# Patient Record
Sex: Female | Born: 1985 | Hispanic: Yes | Marital: Single | Smoking: Never smoker
Health system: Southern US, Community
[De-identification: ages and names within clinical notes are randomized; demographics above are authoritative.]

## PROBLEM LIST (undated history)

## (undated) ENCOUNTER — Inpatient Hospital Stay: Payer: Self-pay

## (undated) DIAGNOSIS — Z789 Other specified health status: Secondary | ICD-10-CM

## (undated) HISTORY — PX: NO PAST SURGERIES: SHX2092

---

## 2014-05-19 NOTE — L&D Delivery Note (Signed)
Delivery Note At 1:46 PM a viable female was delivered via Vaginal, Spontaneous Delivery Occiput Anterior presentation. Vtx, Ant and post shoulder and body del completely and to mom's abd. APGAR: 9, 9; weight 6 lb 14.8 oz (3140 g).   Placenta status: Intact, Spontaneous.  Cord:  3 VC. Cord blood collected. Calcifications noted on placenta.   Anesthesia: Epidural  Episiotomy:  none Lacerations:  2nd degree perineal/vaginal lac Suture Repair: 3.0 vicryl rapide Est. Blood Loss (mL):    Mom to postpartum.  Baby to Couplet care / Skin to Skin.  Sharee Pimplearon W Catharina Pica 05/08/2015, 2:12 PM

## 2014-11-30 ENCOUNTER — Other Ambulatory Visit: Payer: Self-pay | Admitting: Family Medicine

## 2014-11-30 DIAGNOSIS — Z3490 Encounter for supervision of normal pregnancy, unspecified, unspecified trimester: Secondary | ICD-10-CM

## 2014-12-22 ENCOUNTER — Ambulatory Visit
Admission: RE | Admit: 2014-12-22 | Discharge: 2014-12-22 | Disposition: A | Discharge status: Home / Self Care | Payer: Self-pay | Source: Ambulatory Visit | Attending: Family Medicine | Admitting: Family Medicine

## 2014-12-22 DIAGNOSIS — Z36 Encounter for antenatal screening of mother: Secondary | ICD-10-CM | POA: Insufficient documentation

## 2014-12-22 DIAGNOSIS — Z3490 Encounter for supervision of normal pregnancy, unspecified, unspecified trimester: Secondary | ICD-10-CM

## 2015-01-29 LAB — OB RESULTS CONSOLE HIV ANTIBODY (ROUTINE TESTING)
HIV: NONREACTIVE
HIV: NONREACTIVE

## 2015-03-29 LAB — OB RESULTS CONSOLE ANTIBODY SCREEN: Antibody Screen: NEGATIVE

## 2015-04-17 LAB — OB RESULTS CONSOLE GBS
GBS: NEGATIVE
GBS: NEGATIVE
STREP GROUP B AG: NEGATIVE
STREP GROUP B AG: POSITIVE

## 2015-04-17 LAB — OB RESULTS CONSOLE GC/CHLAMYDIA
CHLAMYDIA, DNA PROBE: NEGATIVE
Chlamydia: NEGATIVE
GC PROBE AMP, GENITAL: NEGATIVE
GC PROBE AMP, GENITAL: NEGATIVE

## 2015-04-17 LAB — OB RESULTS CONSOLE RPR
RPR: NONREACTIVE
RPR: NONREACTIVE

## 2015-05-07 ENCOUNTER — Inpatient Hospital Stay
Admission: EM | Admit: 2015-05-07 | Discharge: 2015-05-10 | DRG: 775 | Disposition: A | Discharge status: Home / Self Care | Payer: Medicaid Other | Attending: Obstetrics and Gynecology | Admitting: Obstetrics and Gynecology

## 2015-05-07 DIAGNOSIS — IMO0001 Reserved for inherently not codable concepts without codable children: Secondary | ICD-10-CM

## 2015-05-07 DIAGNOSIS — D649 Anemia, unspecified: Secondary | ICD-10-CM | POA: Diagnosis present

## 2015-05-07 DIAGNOSIS — O9902 Anemia complicating childbirth: Secondary | ICD-10-CM | POA: Diagnosis present

## 2015-05-07 DIAGNOSIS — Z3A39 39 weeks gestation of pregnancy: Secondary | ICD-10-CM

## 2015-05-07 NOTE — OB Triage Note (Signed)
Pt came in complaining of contractions starting this afternoon. Denies any bleeding or leaking fluid. Good fetal movement.

## 2015-05-08 ENCOUNTER — Inpatient Hospital Stay: Payer: Medicaid Other | Admitting: Anesthesiology

## 2015-05-08 DIAGNOSIS — IMO0001 Reserved for inherently not codable concepts without codable children: Secondary | ICD-10-CM

## 2015-05-08 DIAGNOSIS — D649 Anemia, unspecified: Secondary | ICD-10-CM | POA: Diagnosis present

## 2015-05-08 DIAGNOSIS — Z3A39 39 weeks gestation of pregnancy: Secondary | ICD-10-CM | POA: Diagnosis not present

## 2015-05-08 DIAGNOSIS — O9902 Anemia complicating childbirth: Secondary | ICD-10-CM | POA: Diagnosis present

## 2015-05-08 LAB — CBC
HEMATOCRIT: 32.6 % — AB (ref 35.0–47.0)
HEMOGLOBIN: 10.3 g/dL — AB (ref 12.0–16.0)
MCH: 25.9 pg — ABNORMAL LOW (ref 26.0–34.0)
MCHC: 31.6 g/dL — ABNORMAL LOW (ref 32.0–36.0)
MCV: 81.8 fL (ref 80.0–100.0)
Platelets: 184 10*3/uL (ref 150–440)
RBC: 3.98 MIL/uL (ref 3.80–5.20)
RDW: 15.9 % — ABNORMAL HIGH (ref 11.5–14.5)
WBC: 10.7 10*3/uL (ref 3.6–11.0)

## 2015-05-08 LAB — TYPE AND SCREEN
ABO/RH(D): A NEG
ANTIBODY SCREEN: POSITIVE

## 2015-05-08 LAB — RAPID HIV SCREEN (HIV 1/2 AB+AG)
HIV 1/2 ANTIBODIES: NONREACTIVE
HIV-1 P24 ANTIGEN - HIV24: NONREACTIVE

## 2015-05-08 LAB — ABO/RH: ABO/RH(D): A NEG

## 2015-05-08 MED ORDER — MISOPROSTOL 200 MCG PO TABS
ORAL_TABLET | ORAL | Status: AC
  Filled 2015-05-08: qty 4

## 2015-05-08 MED ORDER — NALOXONE HCL 0.4 MG/ML IJ SOLN: 0.4000 mg | INTRAMUSCULAR | Status: DC | PRN

## 2015-05-08 MED ORDER — SENNOSIDES-DOCUSATE SODIUM 8.6-50 MG PO TABS
2.0000 | ORAL_TABLET | ORAL | Status: DC
  Administered 2015-05-08 – 2015-05-09 (×2): 2 via ORAL
  Filled 2015-05-08 (×2): qty 2

## 2015-05-08 MED ORDER — BENZOCAINE-MENTHOL 20-0.5 % EX AERO
1.0000 "application " | INHALATION_SPRAY | CUTANEOUS | Status: DC | PRN
  Administered 2015-05-08: 1 via TOPICAL
  Filled 2015-05-08: qty 56

## 2015-05-08 MED ORDER — IBUPROFEN 600 MG PO TABS
600.0000 mg | ORAL_TABLET | Freq: Four times a day (QID) | ORAL | Status: DC
  Administered 2015-05-08 – 2015-05-09 (×5): 600 mg via ORAL
  Filled 2015-05-08 (×5): qty 1

## 2015-05-08 MED ORDER — OXYTOCIN 40 UNITS IN LACTATED RINGERS INFUSION - SIMPLE MED
62.5000 mL/h | INTRAVENOUS | Status: DC | PRN
  Filled 2015-05-08: qty 1000

## 2015-05-08 MED ORDER — FENTANYL 2.5 MCG/ML W/ROPIVACAINE 0.2% IN NS 100 ML EPIDURAL INFUSION (ARMC-ANES)
EPIDURAL | Status: AC
  Filled 2015-05-08: qty 100

## 2015-05-08 MED ORDER — OXYTOCIN 40 UNITS IN LACTATED RINGERS INFUSION - SIMPLE MED
62.5000 mL/h | INTRAVENOUS | Status: DC
  Filled 2015-05-08: qty 1000

## 2015-05-08 MED ORDER — OXYTOCIN 10 UNIT/ML IJ SOLN
INTRAMUSCULAR | Status: AC
  Filled 2015-05-08: qty 2

## 2015-05-08 MED ORDER — DIPHENHYDRAMINE HCL 25 MG PO CAPS: 25.0000 mg | ORAL_CAPSULE | Freq: Four times a day (QID) | ORAL | Status: DC | PRN

## 2015-05-08 MED ORDER — ONDANSETRON HCL 4 MG/2ML IJ SOLN: 4.0000 mg | Freq: Three times a day (TID) | INTRAMUSCULAR | Status: DC | PRN

## 2015-05-08 MED ORDER — LIDOCAINE-EPINEPHRINE (PF) 1.5 %-1:200000 IJ SOLN
INTRAMUSCULAR | Status: DC | PRN
  Administered 2015-05-08: 3 mL via PERINEURAL

## 2015-05-08 MED ORDER — DIPHENHYDRAMINE HCL 50 MG/ML IJ SOLN: 12.5000 mg | INTRAMUSCULAR | Status: DC | PRN

## 2015-05-08 MED ORDER — NALOXONE HCL 2 MG/2ML IJ SOSY
1.0000 ug/kg/h | PREFILLED_SYRINGE | INTRAVENOUS | Status: DC | PRN
  Filled 2015-05-08: qty 2

## 2015-05-08 MED ORDER — LACTATED RINGERS IV SOLN
INTRAVENOUS | Status: DC
  Administered 2015-05-08 (×2): via INTRAVENOUS

## 2015-05-08 MED ORDER — SODIUM CHLORIDE 0.9 % IJ SOLN: 3.0000 mL | Freq: Two times a day (BID) | INTRAMUSCULAR | Status: DC

## 2015-05-08 MED ORDER — NALBUPHINE HCL 10 MG/ML IJ SOLN: 5.0000 mg | Freq: Once | INTRAMUSCULAR | Status: DC | PRN

## 2015-05-08 MED ORDER — SODIUM CHLORIDE 0.9 % IJ SOLN: 3.0000 mL | INTRAMUSCULAR | Status: DC | PRN

## 2015-05-08 MED ORDER — BUPIVACAINE HCL (PF) 0.25 % IJ SOLN
INTRAMUSCULAR | Status: DC | PRN
  Administered 2015-05-08 (×2): 4 mL via EPIDURAL

## 2015-05-08 MED ORDER — FENTANYL 2.5 MCG/ML W/ROPIVACAINE 0.2% IN NS 100 ML EPIDURAL INFUSION (ARMC-ANES)
10.0000 mL/h | EPIDURAL | Status: DC
  Administered 2015-05-08: 9 mL/h via EPIDURAL

## 2015-05-08 MED ORDER — BISACODYL 10 MG RE SUPP: 10.0000 mg | Freq: Every day | RECTAL | Status: DC | PRN

## 2015-05-08 MED ORDER — TETANUS-DIPHTH-ACELL PERTUSSIS 5-2.5-18.5 LF-MCG/0.5 IM SUSP: 0.5000 mL | Freq: Once | INTRAMUSCULAR | Status: DC

## 2015-05-08 MED ORDER — NALBUPHINE HCL 10 MG/ML IJ SOLN
5.0000 mg | INTRAMUSCULAR | Status: DC | PRN
Start: 2015-05-08 — End: 2015-05-08

## 2015-05-08 MED ORDER — DIBUCAINE 1 % RE OINT: 1.0000 "application " | TOPICAL_OINTMENT | RECTAL | Status: DC | PRN

## 2015-05-08 MED ORDER — WITCH HAZEL-GLYCERIN EX PADS: 1.0000 "application " | MEDICATED_PAD | CUTANEOUS | Status: DC | PRN

## 2015-05-08 MED ORDER — SODIUM CHLORIDE 0.9 % IV SOLN
250.0000 mL | INTRAVENOUS | Status: DC | PRN
Start: 2015-05-08 — End: 2015-05-09

## 2015-05-08 MED ORDER — BUTORPHANOL TARTRATE 1 MG/ML IJ SOLN: 1.0000 mg | INTRAMUSCULAR | Status: DC | PRN

## 2015-05-08 MED ORDER — ACETAMINOPHEN 325 MG PO TABS: 650.0000 mg | ORAL_TABLET | ORAL | Status: DC | PRN

## 2015-05-08 MED ORDER — NALBUPHINE HCL 10 MG/ML IJ SOLN: 5.0000 mg | INTRAMUSCULAR | Status: DC | PRN

## 2015-05-08 MED ORDER — SODIUM CHLORIDE 0.9 % IJ SOLN
3.0000 mL | INTRAMUSCULAR | Status: DC | PRN
Start: 2015-05-08 — End: 2015-05-09

## 2015-05-08 MED ORDER — ONDANSETRON HCL 4 MG/2ML IJ SOLN: 4.0000 mg | INTRAMUSCULAR | Status: DC | PRN

## 2015-05-08 MED ORDER — ONDANSETRON HCL 4 MG PO TABS: 4.0000 mg | ORAL_TABLET | ORAL | Status: DC | PRN

## 2015-05-08 MED ORDER — IBUPROFEN 600 MG PO TABS
600.0000 mg | ORAL_TABLET | Freq: Four times a day (QID) | ORAL | Status: DC
  Administered 2015-05-08: 600 mg via ORAL
  Filled 2015-05-08: qty 1

## 2015-05-08 MED ORDER — SIMETHICONE 80 MG PO CHEW: 80.0000 mg | CHEWABLE_TABLET | ORAL | Status: DC | PRN

## 2015-05-08 MED ORDER — LACTATED RINGERS IV SOLN: 500.0000 mL | INTRAVENOUS | Status: DC | PRN

## 2015-05-08 MED ORDER — OXYCODONE-ACETAMINOPHEN 5-325 MG PO TABS
2.0000 | ORAL_TABLET | ORAL | Status: DC | PRN
  Filled 2015-05-08: qty 2

## 2015-05-08 MED ORDER — PRENATAL MULTIVITAMIN CH
1.0000 | ORAL_TABLET | Freq: Every day | ORAL | Status: DC
  Administered 2015-05-09: 1 via ORAL
  Filled 2015-05-08: qty 1

## 2015-05-08 MED ORDER — OXYTOCIN BOLUS FROM INFUSION
500.0000 mL | INTRAVENOUS | Status: DC
  Administered 2015-05-08: 500 mL via INTRAVENOUS

## 2015-05-08 MED ORDER — DIPHENHYDRAMINE HCL 25 MG PO CAPS: 25.0000 mg | ORAL_CAPSULE | ORAL | Status: DC | PRN

## 2015-05-08 MED ORDER — CITRIC ACID-SODIUM CITRATE 334-500 MG/5ML PO SOLN: 30.0000 mL | ORAL | Status: DC | PRN

## 2015-05-08 MED ORDER — MEASLES, MUMPS & RUBELLA VAC ~~LOC~~ INJ
0.5000 mL | INJECTION | Freq: Once | SUBCUTANEOUS | Status: DC
Start: 2015-05-09 — End: 2015-05-09

## 2015-05-08 MED ORDER — ONDANSETRON HCL 4 MG/2ML IJ SOLN: 4.0000 mg | Freq: Four times a day (QID) | INTRAMUSCULAR | Status: DC | PRN

## 2015-05-08 MED ORDER — LANOLIN HYDROUS EX OINT: TOPICAL_OINTMENT | CUTANEOUS | Status: DC | PRN

## 2015-05-08 MED ORDER — LIDOCAINE HCL (PF) 1 % IJ SOLN
INTRAMUSCULAR | Status: AC
  Administered 2015-05-08: 3 mL
  Filled 2015-05-08: qty 30

## 2015-05-08 MED ORDER — AMMONIA AROMATIC IN INHA
RESPIRATORY_TRACT | Status: AC
  Filled 2015-05-08: qty 10

## 2015-05-08 MED ORDER — ZOLPIDEM TARTRATE 5 MG PO TABS: 5.0000 mg | ORAL_TABLET | Freq: Every evening | ORAL | Status: DC | PRN

## 2015-05-08 MED ORDER — FLEET ENEMA 7-19 GM/118ML RE ENEM
1.0000 | ENEMA | Freq: Every day | RECTAL | Status: DC | PRN
Start: 2015-05-08 — End: 2015-05-10

## 2015-05-08 MED ORDER — LIDOCAINE HCL (PF) 1 % IJ SOLN
30.0000 mL | INTRAMUSCULAR | Status: DC | PRN
  Filled 2015-05-08: qty 30

## 2015-05-08 NOTE — Progress Notes (Signed)
Pt up to bathroom, peri care, pad changed and ice applied.  Pt up to wheelchair to transfer to Mother/Baby room 338.

## 2015-05-08 NOTE — Progress Notes (Addendum)
S: Pain is intense and pt requesting epidural. + CTX, + LOF, No VB. AROM earlier for clear fluid per Dr Derrill KayGoodman.  Ceasar Mons: Filed Vitals:   05/08/15 0310 05/08/15 0312 05/08/15 0441 05/08/15 0726  BP:  121/77 129/70   Pulse:  79 82   Temp: 97.7 F (36.5 C)  98.4 F (36.9 C) 98.6 F (37 C)  TempSrc: Oral  Oral Oral  Resp:  20 20   Height:      Weight:        Gen: NAD, AAOx3      Abd: FNTTP      Ext: Non-tender, Nonedmeatous    FHT: + mod var + accelerations no decelerations, Cat 1 TOCO: Q 1 -1/2 mins SVE: 5-6cms/100%/vtx   A/P:  [age] y.o. yo G1P0 at 6653w5d for delivery.   Labor:progressing  FWB: Reassuring Cat 1 tracing. EFW 6#6oz  GBS: neg  Contraception: unknown  Planning epidural. Anesthesia enroute  Antibody screen positive   Sharee Pimplearon W Jones 8:22 AM

## 2015-05-08 NOTE — H&P (Signed)
HISTORY AND PHYSICAL  HISTORY OF PRESENT ILLNESS: Ms. Erin Hartman is a 29 y.o. G1P0 at 7131w5d by 8 week ultrasound with an uncomplicated pregnancy presenting for evaluation of labor.   She has been having contractions Q4-5 and denies leakage of fluid, vaginal bleeding, or decreased fetal movement.     REVIEW OF SYSTEMS: A complete review of systems was performed and was specifically negative for headache, changes in vision, RUQ pain, shortness of breath, chest pain, lower extremity edema and dysuria.   HISTORY:  History reviewed. No pertinent past medical history.  History reviewed. No pertinent past surgical history.  No current facility-administered medications on file prior to encounter.   No current outpatient prescriptions on file prior to encounter.     No Known Allergies  OB History  Gravida Para Term Preterm AB SAB TAB Ectopic Multiple Living  1             # Outcome Date GA Lbr Len/2nd Weight Sex Delivery Anes PTL Lv  1 Current               Gynecologic History: History of Abnormal Pap Smear: none History of STI: none  Social History  Substance Use Topics  . Smoking status: Never Smoker   . Smokeless tobacco: None  . Alcohol Use: No    PHYSICAL EXAM: Temp:  [97.7 F (36.5 C)-98.7 F (37.1 C)] 98.4 F (36.9 C) (12/20 0441) Pulse Rate:  [78-88] 82 (12/20 0441) Resp:  [18-20] 20 (12/20 0441) BP: (109-129)/(70-94) 129/70 mmHg (12/20 0441) Weight:  [165 lb (74.844 kg)] 165 lb (74.844 kg) (12/19 2329)  GENERAL: NAD AAOx3 CHEST:CTAB no increased work of breathing CV:RRR no appreciable murmurs, rubs, gallops ABDOMEN: gravid, nontender, EFW 3400g by Le[place] EXTREMITIES:  Warm and well-perfused, nontender, nonedematous, 2+ DTRs, no clonus CERVIX: 5/ 90/ -1 SPECULUM: deferred  FHT:140s baseline with mod variability + accelerations and no decelerations  Toco: Q4-5  DIAGNOSTIC STUDIES:  Recent Labs Lab 05/08/15 0003  WBC 10.7  HGB 10.3*  HCT 32.6*   PLT 184    PRENATAL STUDIES:  Prenatal Labs:  MBT: antibody negative ; Rubella immune, Varicella immune, HIV neg, RPR neg, Hep B neg, GC/CT neg, GBS neg, glucola 120    ASSESSMENT AND PLAN:  1. Fetal Well being  - Fetal Tracing: reassuring - Group B Streptococcus: ne - Genetic Screening: not applicable - Presentation: VTX confirmed by DMG   2. Routine OB: - Prenatal labs reviewed, as above - Rh neg  3. Evaluation of Labor:  -  Contractions external toco in place -  Pelvis unproven - Membranes ruptured, head well applied, clear fluid.  -  Plan for management with serial vaginal exams, augmentation as necessary.   4. Post Partum Planning: - Infant feeding: breastfeeding - [facility]

## 2015-05-08 NOTE — Progress Notes (Signed)
S: Pushing with  epidural. + CTX, no LOF, VB O: Filed Vitals:   05/08/15 1017 05/08/15 1037 05/08/15 1127 05/08/15 1136  BP: 139/111 149/80  122/100  Pulse: 124 113  166  Temp:   98.9 F (37.2 C)   TempSrc:   Oral   Resp:      Height:      Weight:        Gen: NAD, AAOx3      Abd: FNTTP      Ext: Non-tender, Nonedmeatous    FHT: + mod var  To occas minimal variability, + accelerations no decelerations, Cat 1, occas variable x 10 secs TOCO: Q 1-23min SVE: C/C/vtx+2   A/P:  29 y.o. yo G1P0 at 7273w5d for delivery  Labor: Pt is pushing effectively,. Epidural turned down to augment pushing.   FWB: Reassuring Cat 1 tracing. EFW ^#6oz  GBS: neg  Contraception: undecided   Sharee PimpleCaron W Latonda Larrivee 12:35 PM

## 2015-05-08 NOTE — Anesthesia Preprocedure Evaluation (Signed)
Anesthesia Evaluation  Patient identified by MRN, date of birth, ID band Patient awake    Reviewed: Allergy & Precautions, H&P , NPO status , Patient's Chart, lab work & pertinent test results  History of Anesthesia Complications Negative for: history of anesthetic complications  Airway Mallampati: II  TM Distance: >3 FB Neck ROM: full    Dental  (+) Teeth Intact   Pulmonary neg pulmonary ROS,    Pulmonary exam normal        Cardiovascular negative cardio ROS Normal cardiovascular exam     Neuro/Psych negative neurological ROS  negative psych ROS   GI/Hepatic negative GI ROS, Neg liver ROS, (+)     (-) substance abuse  ,   Endo/Other  negative endocrine ROS  Renal/GU negative Renal ROS  negative genitourinary   Musculoskeletal   Abdominal   Peds  Hematology negative hematology ROS (+)   Anesthesia Other Findings   Reproductive/Obstetrics (+) Pregnancy                             Anesthesia Physical Anesthesia Plan  ASA: II  Anesthesia Plan: Epidural   Post-op Pain Management:    Induction:   Airway Management Planned:   Additional Equipment:   Intra-op Plan:   Post-operative Plan:   Informed Consent: I have reviewed the patients History and Physical, chart, labs and discussed the procedure including the risks, benefits and alternatives for the proposed anesthesia with the patient or authorized representative who has indicated his/her understanding and acceptance.     Plan Discussed with: Anesthesiologist  Anesthesia Plan Comments:         Anesthesia Quick Evaluation

## 2015-05-08 NOTE — Anesthesia Procedure Notes (Signed)
Epidural Patient location during procedure: OB Start time: 05/08/2015 8:30 AM End time: 05/08/2015 8:53 AM  Staffing Anesthesiologist: Rosaria FerriesPISCITELLO, JOSEPH K Resident/CRNA: Ginger CarneMICHELET, Savior Himebaugh Performed by: resident/CRNA   Preanesthetic Checklist Completed: patient identified, site marked, surgical consent, pre-op evaluation, timeout performed, IV checked, risks and benefits discussed and monitors and equipment checked  Epidural Patient position: sitting Prep: ChloraPrep Patient monitoring: heart rate, continuous pulse ox and blood pressure Approach: midline Location: L4-L5 Injection technique: LOR air  Needle:  Needle type: Tuohy  Needle gauge: 17 G Needle length: 9 cm and 9 Needle insertion depth: 8 cm Catheter type: closed end flexible Catheter size: 19 Gauge Catheter at skin depth: 12 cm Test dose: negative and 1.5% lidocaine with Epi 1:200 K  Assessment Sensory level: T8 Events: blood not aspirated, injection not painful, no injection resistance, negative IV test and no paresthesia  Additional Notes   Patient tolerated the insertion well without immediate complications.Reason for block:procedure for pain

## 2015-05-09 LAB — CBC
HEMATOCRIT: 29.2 % — AB (ref 35.0–47.0)
Hemoglobin: 9.1 g/dL — ABNORMAL LOW (ref 12.0–16.0)
MCH: 24.7 pg — AB (ref 26.0–34.0)
MCHC: 31 g/dL — ABNORMAL LOW (ref 32.0–36.0)
MCV: 79.6 fL — AB (ref 80.0–100.0)
Platelets: 153 10*3/uL (ref 150–440)
RBC: 3.67 MIL/uL — AB (ref 3.80–5.20)
RDW: 16 % — ABNORMAL HIGH (ref 11.5–14.5)
WBC: 15.9 10*3/uL — AB (ref 3.6–11.0)

## 2015-05-09 LAB — RPR: RPR Ser Ql: NONREACTIVE

## 2015-05-09 LAB — FETAL SCREEN: FETAL SCREEN: NEGATIVE

## 2015-05-09 MED ORDER — RHO D IMMUNE GLOBULIN 1500 UNIT/2ML IJ SOSY
300.0000 ug | PREFILLED_SYRINGE | Freq: Once | INTRAMUSCULAR | Status: AC
  Administered 2015-05-09: 300 ug via INTRAMUSCULAR
  Filled 2015-05-09: qty 2

## 2015-05-09 NOTE — Progress Notes (Addendum)
Post Partum Day 1 Subjective: no complaints  Objective: Blood pressure 99/70, pulse 67, temperature 98.2 F (36.8 C), temperature source Oral, resp. rate 18, height 5\' 1"  (1.549 m), weight 165 lb (74.844 kg), last menstrual period 07/27/2014, SpO2 99 %, unknown if currently breastfeeding.  Physical Exam:  General: alert and cooperative Lochia: appropriate Uterine Fundus: firm DVT Evaluation: neg Homans   Recent Labs  05/08/15 0003 05/09/15 0454  HGB 10.3* 9.1*  HCT 32.6* 29.2*  WBC 10.7 15.9*  PLT 184 153    Assessment/Plan: 1. PPD#1 2. Anemia P: FE 2. DC in am   LOS: 1 day   Erin Hartman W Erin Hartman 05/09/2015, 7:03 AM

## 2015-05-09 NOTE — Anesthesia Post-op Follow-up Note (Signed)
  Anesthesia Pain Follow-up Note  Patient: Erin Hartman  Day #: 1  Date of Follow-up: 05/09/2015 Time: 8:19 AM  Last Vitals:  Filed Vitals:   05/09/15 0421 05/09/15 0742  BP: 99/70 108/67  Pulse: 67 80  Temp: 36.8 C 36.6 C  Resp: 18 18    Level of Consciousness: alert  Pain: none   Side Effects:None  Catheter Site Exam:clean, dry, no drainage  Plan: D/C from anesthesia care  Chong SicilianLopez,  Teng Decou

## 2015-05-09 NOTE — Anesthesia Postprocedure Evaluation (Signed)
  Anesthesia Post-op Note  Patient: Erin Hartman  Procedure(s) Performed: epidural  Anesthesia type:No value filed.  Patient location: 338  Post pain: Pain level controlled  Post assessment: Post-op Vital signs reviewed, Patient's Cardiovascular Status Stable, Respiratory Function Stable, Patent Airway and No signs of Nausea or vomiting  Post vital signs: Reviewed and stable  Last Vitals:  Filed Vitals:   05/09/15 0421 05/09/15 0742  BP: 99/70 108/67  Pulse: 67 80  Temp: 36.8 C 36.6 C  Resp: 18 18    Level of consciousness: awake, alert  and patient cooperative  Complications: No apparent anesthesia complications
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
## 2015-05-10 LAB — RHOGAM INJECTION: Unit division: 0

## 2015-05-10 MED ORDER — IBUPROFEN 600 MG PO TABS
600.0000 mg | ORAL_TABLET | Freq: Four times a day (QID) | ORAL | Status: DC
  Administered 2015-05-10: 600 mg via ORAL
  Filled 2015-05-10: qty 1

## 2015-05-10 MED ORDER — IBUPROFEN 600 MG PO TABS
600.0000 mg | ORAL_TABLET | Freq: Four times a day (QID) | ORAL | Status: DC
Start: 2015-05-10 — End: 2019-04-05

## 2015-05-10 NOTE — Discharge Instructions (Signed)
Care After Vaginal Delivery °Congratulations on your new baby!! ° °Refer to this sheet in the next few weeks. These discharge instructions provide you with information on caring for yourself after delivery. Your caregiver may also give you specific instructions. Your treatment has been planned according to the most current medical practices available, but problems sometimes occur. Call your caregiver if you have any problems or questions after you go home. ° °HOME CARE INSTRUCTIONS °· Take over-the-counter or prescription medicines only as directed by your caregiver or pharmacist. °· Do not drink alcohol, especially if you are breastfeeding or taking medicine to relieve pain. °· Do not chew or smoke tobacco. °· Do not use illegal drugs. °· Continue to use good perineal care. Good perineal care includes: °¨ Wiping your perineum from front to back. °¨ Keeping your perineum clean. °· Do not use tampons or douche until your caregiver says it is okay. °· Shower, wash your hair, and take tub baths as directed by your caregiver. °· Wear a well-fitting bra that provides breast support. °· Eat healthy foods. °· Drink enough fluids to keep your urine clear or pale yellow. °· Eat high-fiber foods such as whole grain cereals and breads, brown rice, beans, and fresh fruits and vegetables every day. These foods may help prevent or relieve constipation. °· Follow your caregiver's recommendations regarding resumption of activities such as climbing stairs, driving, lifting, exercising, or traveling. Specifically, no driving for two weeks, so that you are comfortable reacting quickly in an emergency. °· Talk to your caregiver about resuming sexual activities. Resumption of sexual activities is dependent upon your risk of infection, your rate of healing, and your comfort and desire to resume sexual activity. Usually we recommend waiting about six weeks, or until your bleeding stops and you are interested in sex. °· Try to have someone  help you with your household activities and your newborn for at least a few days after you leave the hospital. Even longer is better. °· Rest as much as possible. Try to rest or take a nap when your newborn is sleeping. Sleep deprivation can be very hard after delivery. °· Increase your activities gradually. °· Keep all of your scheduled postpartum appointments. It is very important to keep your scheduled follow-up appointments. At these appointments, your caregiver will be checking to make sure that you are healing physically and emotionally. ° °SEEK MEDICAL CARE IF:  °· You are passing large clots from your vagina.  °· You have a foul smelling discharge from your vagina. °· You have trouble urinating. °· You are urinating frequently. °· You have pain when you urinate. °· You have a change in your bowel movements. °· You have increasing redness, pain, or swelling near your vaginal incision (episiotomy) or vaginal tear. °· You have pus draining from your episiotomy or vaginal tear. °· Your episiotomy or vaginal tear is separating. °· You have painful, hard, or reddened breasts. °· You have a severe headache. °· You have blurred vision or see spots. °· You feel sad or depressed. °· You have thoughts of hurting yourself or your newborn. °· You have questions about your care, the care of your newborn, or medicines. °· You are dizzy or light-headed. °· You have a rash. °· You have nausea or vomiting. °· You were breastfeeding and have not had a menstrual period within 12 weeks after you stopped breastfeeding. °· You are not breastfeeding and have not had a menstrual period by the 12th week after delivery. °· You   have a fever. ° °SEEK IMMEDIATE MEDICAL CARE IF:  °· You have persistent pain. °· You have chest pain. °· You have shortness of breath. °· You faint. °· You have leg pain. °· You have stomach pain. °· Your vaginal bleeding saturates two or more sanitary pads in 1 hour. ° °MAKE SURE YOU:  °· Understand these  instructions. °· Will get help right away if you are not doing well or get worse. °·  °Document Released: 05/02/2000 Document Revised: 09/19/2013 Document Reviewed: 12/31/2011 ° °Exit[employer]® Patient Information ©2015 [employer]. This information is not intended to replace advice given to you by your health care provider. Make sure you discuss any questions you have with your health care provider. ° °

## 2015-05-10 NOTE — Discharge Summary (Signed)
Obstetric Discharge Summary Reason for Admission: onset of labor Prenatal Procedures: none Intrapartum Procedures: spontaneous vaginal delivery Postpartum Procedures: Rho(D) Ig Complications-Operative and Postpartum: none -2nd deg lac HEMOGLOBIN  Date Value Ref Range Status  05/09/2015 9.1* 12.0 - 16.0 g/dL Final   HCT  Date Value Ref Range Status  05/09/2015 29.2* 35.0 - 47.0 % Final    Physical Exam:  General: alert, cooperative and no distress Lochia: appropriate Uterine Fundus: firm Incision: healing well DVT Evaluation: No evidence of DVT seen on physical exam.  Discharge Diagnoses: Term Pregnancy-delivered  Discharge Information: Date: 05/10/2015 Activity: pelvic rest Diet: routine Medications: Ibuprofen Condition: stable Instructions: refer to practice specific booklet Discharge to: home   Newborn Data: Live born female  Birth Weight: 6 lb 14.8 oz (3140 g) APGAR: 9, 9  Home with mother.  Erin Hartman, Erin Hartman 05/10/2015, 1:44 PM

## 2016-10-07 LAB — OB RESULTS CONSOLE RPR: RPR: NONREACTIVE

## 2016-10-07 LAB — OB RESULTS CONSOLE HEPATITIS B SURFACE ANTIGEN: HEP B S AG: NEGATIVE

## 2016-10-07 LAB — OB RESULTS CONSOLE RUBELLA ANTIBODY, IGM: Rubella: IMMUNE

## 2016-10-07 LAB — OB RESULTS CONSOLE ABO/RH: RH Type: NEGATIVE

## 2016-10-07 LAB — OB RESULTS CONSOLE ANTIBODY SCREEN: ANTIBODY SCREEN: NEGATIVE

## 2016-10-07 LAB — OB RESULTS CONSOLE VARICELLA ZOSTER ANTIBODY, IGG: Varicella: IMMUNE

## 2016-10-09 LAB — OB RESULTS CONSOLE GC/CHLAMYDIA
Chlamydia: NEGATIVE
GC PROBE AMP, GENITAL: NEGATIVE

## 2016-10-09 LAB — OB RESULTS CONSOLE HIV ANTIBODY (ROUTINE TESTING): HIV: NONREACTIVE

## 2016-11-07 ENCOUNTER — Other Ambulatory Visit: Payer: Self-pay | Admitting: Advanced Practice Midwife

## 2016-11-07 DIAGNOSIS — Z3482 Encounter for supervision of other normal pregnancy, second trimester: Secondary | ICD-10-CM

## 2017-01-03 ENCOUNTER — Encounter: Payer: Self-pay | Admitting: Emergency Medicine

## 2017-01-03 ENCOUNTER — Observation Stay
Admission: EM | Admit: 2017-01-03 | Discharge: 2017-01-03 | Disposition: A | Discharge status: Home / Self Care | Payer: Self-pay | Attending: Obstetrics and Gynecology | Admitting: Obstetrics and Gynecology

## 2017-01-03 ENCOUNTER — Emergency Department: Payer: Self-pay

## 2017-01-03 DIAGNOSIS — O368190 Decreased fetal movements, unspecified trimester, not applicable or unspecified: Secondary | ICD-10-CM | POA: Diagnosis present

## 2017-01-03 DIAGNOSIS — O99511 Diseases of the respiratory system complicating pregnancy, first trimester: Secondary | ICD-10-CM | POA: Insufficient documentation

## 2017-01-03 DIAGNOSIS — J069 Acute upper respiratory infection, unspecified: Secondary | ICD-10-CM | POA: Insufficient documentation

## 2017-01-03 DIAGNOSIS — O368120 Decreased fetal movements, second trimester, not applicable or unspecified: Principal | ICD-10-CM | POA: Insufficient documentation

## 2017-01-03 DIAGNOSIS — Z3A27 27 weeks gestation of pregnancy: Secondary | ICD-10-CM | POA: Insufficient documentation

## 2017-01-03 DIAGNOSIS — B9789 Other viral agents as the cause of diseases classified elsewhere: Secondary | ICD-10-CM

## 2017-01-03 HISTORY — DX: Other specified health status: Z78.9

## 2017-01-03 LAB — CBC WITH DIFFERENTIAL/PLATELET
BASOS ABS: 0 10*3/uL (ref 0–0.1)
Basophils Relative: 0 %
Eosinophils Absolute: 0 10*3/uL (ref 0–0.7)
Eosinophils Relative: 0 %
HEMATOCRIT: 32.1 % — AB (ref 35.0–47.0)
HEMOGLOBIN: 10.5 g/dL — AB (ref 12.0–16.0)
LYMPHS PCT: 14 %
Lymphs Abs: 1.6 10*3/uL (ref 1.0–3.6)
MCH: 28.3 pg (ref 26.0–34.0)
MCHC: 32.8 g/dL (ref 32.0–36.0)
MCV: 86.1 fL (ref 80.0–100.0)
Monocytes Absolute: 0.8 10*3/uL (ref 0.2–0.9)
Monocytes Relative: 7 %
NEUTROS ABS: 8.6 10*3/uL — AB (ref 1.4–6.5)
NEUTROS PCT: 79 %
PLATELETS: 242 10*3/uL (ref 150–440)
RBC: 3.73 MIL/uL — AB (ref 3.80–5.20)
RDW: 13.3 % (ref 11.5–14.5)
WBC: 11 10*3/uL (ref 3.6–11.0)

## 2017-01-03 LAB — URINALYSIS, COMPLETE (UACMP) WITH MICROSCOPIC
BACTERIA UA: NONE SEEN
BILIRUBIN URINE: NEGATIVE
Glucose, UA: NEGATIVE mg/dL
Hgb urine dipstick: NEGATIVE
KETONES UR: 20 mg/dL — AB
Nitrite: NEGATIVE
PH: 7 (ref 5.0–8.0)
PROTEIN: NEGATIVE mg/dL
Specific Gravity, Urine: 1.014 (ref 1.005–1.030)

## 2017-01-03 LAB — COMPREHENSIVE METABOLIC PANEL
ALT: 9 U/L — ABNORMAL LOW (ref 14–54)
ANION GAP: 7 (ref 5–15)
AST: 14 U/L — ABNORMAL LOW (ref 15–41)
Albumin: 3 g/dL — ABNORMAL LOW (ref 3.5–5.0)
Alkaline Phosphatase: 76 U/L (ref 38–126)
BILIRUBIN TOTAL: 0.4 mg/dL (ref 0.3–1.2)
BUN: 7 mg/dL (ref 6–20)
CHLORIDE: 105 mmol/L (ref 101–111)
CO2: 25 mmol/L (ref 22–32)
Calcium: 8.7 mg/dL — ABNORMAL LOW (ref 8.9–10.3)
Creatinine, Ser: 0.44 mg/dL (ref 0.44–1.00)
GFR calc Af Amer: >60 mL/min (ref >60)
Glucose, Bld: 85 mg/dL (ref 65–99)
POTASSIUM: 3.5 mmol/L (ref 3.5–5.1)
Sodium: 137 mmol/L (ref 135–145)
TOTAL PROTEIN: 6.8 g/dL (ref 6.5–8.1)

## 2017-01-03 LAB — MONONUCLEOSIS SCREEN: Mono Screen: NEGATIVE

## 2017-01-03 LAB — POCT RAPID STREP A: Streptococcus, Group A Screen (Direct): NEGATIVE

## 2017-01-03 NOTE — Discharge Instructions (Signed)
Return to ER for trouble breathing, abdominal pain, painful breathing, shortness of breath, or any other symptoms concerning to you.  Regrese a la sala de Emergencias si experimenta dificultad respirando, dolor abdominal,respiracion dolorosa, respiracion acortada, u otros sintomas que le preocupen.   Follow up with [facility]. On Monday.

## 2017-01-03 NOTE — ED Provider Notes (Addendum)
[facility] Department Provider Note ____________________________________________   I have reviewed the triage vital signs and the triage nursing note.  HISTORY  Chief Complaint Cough   Historian Patient  HPI Erin Hartman is a 31 y.o. female who is G2 P1, proximally 6 months pregnant, presents with cough cold and congestion for about 2 weeks now. States that she had subjective fever about one week ago. She is continuing to have nasal drainage, nasal congestion, as well as cough that is occasionally productive of sputum, which is somewhat thick, and occasionally has streaks of blood.  Patient states that she is having rib and side pains especially when she coughs. She came in today because of the duration of the upper respiratory symptoms, and she felt that he wasn't moving as much.  She is also having a mild global headache. No vision changes. No weakness or numbness focally. Denies nausea or vomiting. Denies diarrhea. Denies pelvic complaints including no vaginal discharge or vaginal bleeding.    History reviewed. No pertinent past medical history.  Patient Active Problem List   Diagnosis Date Noted  . Active labor at term 05/08/2015  . Active labor 05/08/2015    History reviewed. No pertinent surgical history.  Prior to Admission medications   Medication Sig Start Date End Date Taking? Authorizing Provider  ibuprofen (ADVIL,MOTRIN) 600 MG tablet Take 1 tablet (600 mg total) by mouth every 6 (six) hours. 05/10/15   Christeen Douglas, MD  Prenatal Vit-Fe Fumarate-FA (PRENATAL MULTIVITAMIN) TABS tablet Take 1 tablet by mouth daily at 12 noon.    [provider]    No Known Allergies  No family history on file.  Social History Social History  Substance Use Topics  . Smoking status: Never Smoker  . Smokeless tobacco: Never Used  . Alcohol use No    Review of Systems  Constitutional:Subjective fever less than a week  ago. Eyes: Negative for visual changes. ENT: Positive for sore throat, sinus drainage, nasal congestion, and nasal drainage. Cardiovascular: States that she has chest pain when she coughs especially in the lower lateral sides of her ribs. Respiratory: Negative for shortness of breath, but positive for intermittent cough. Gastrointestinal: Negative for vomiting and diarrhea.  She's had some side abdomen pain especially on the left side especially when she coughs. Genitourinary: Negative for dysuria. Musculoskeletal: Negative for back pain. Skin: Negative for rash. Neurological: Positive for mild global headache..  ____________________________________________   PHYSICAL EXAM:  VITAL SIGNS: ED Triage Vitals  Enc Vitals Group     BP 01/03/17 0830 120/78     Pulse Rate 01/03/17 0830 88     Resp 01/03/17 0830 16     Temp 01/03/17 0830 98.3 F (36.8 C)     Temp Source 01/03/17 0830 Oral     SpO2 01/03/17 0830 100 %     Weight 01/03/17 0831 135 lb (61.2 kg)     Height 01/03/17 0831 5\' 1"  (1.549 m)     Head Circumference --      Peak Flow --      Pain Score 01/03/17 0830 0     Pain Loc --      Pain Edu? --      Excl. in GC? --      Constitutional: Alert and oriented. Well appearing and in no distress. HEENT   Head: Normocephalic and atraumatic.      Eyes: Conjunctivae are normal. Pupils equal and round.       Ears:  Nose: No congestion/rhinnorhea.   Mouth/Throat: Mucous membranes are moist.  Mild pharyngeal erythema without tonsillar exudates or enlargement.   Neck: No stridor.  Neck supple. No lymphadenopathy. Cardiovascular/Chest: Normal rate, regular rhythm.  No murmurs, rubs, or gallops. Respiratory: Normal respiratory effort without tachypnea nor retractions. Breath sounds are clear and equal bilaterally. No wheezes/rales/rhonchi. Gastrointestinal: Soft. No distention, no guarding, no rebound. Nontender.  Gravid slightly above the umbilicus. Mildly nontender  along the sides without any focal tenderness.  Genitourinary/rectal:Deferred Musculoskeletal: Nontender with normal range of motion in all extremities. No joint effusions.  No lower extremity tenderness.  No edema. Neurologic:  Normal speech and language. No gross or focal neurologic deficits are appreciated. Skin:  Skin is warm, dry and intact. No rash noted. Psychiatric: Mood and affect are normal. Speech and behavior are normal. Patient exhibits appropriate insight and judgment.   ____________________________________________  LABS (pertinent positives/negatives)  Labs Reviewed  COMPREHENSIVE METABOLIC PANEL - Abnormal; Notable for the following:       Result Value   Calcium 8.7 (*)    Albumin 3.0 (*)    AST 14 (*)    ALT 9 (*)    All other components within normal limits  CBC WITH DIFFERENTIAL/PLATELET - Abnormal; Notable for the following:    RBC 3.73 (*)    Hemoglobin 10.5 (*)    HCT 32.1 (*)    Neutro Abs 8.6 (*)    All other components within normal limits  URINALYSIS, COMPLETE (UACMP) WITH MICROSCOPIC - Abnormal; Notable for the following:    Color, Urine YELLOW (*)    APPearance CLEAR (*)    Ketones, ur 20 (*)    Leukocytes, UA MODERATE (*)    Squamous Epithelial / LPF 0-5 (*)    All other components within normal limits  MONONUCLEOSIS SCREEN  POCT RAPID STREP A    ____________________________________________    EKG I, Governor Rooks, MD, the attending physician have personally viewed and interpreted all ECGs.  None ____________________________________________  RADIOLOGY All Xrays were viewed by me. Imaging interpreted by Radiologist.  Chest x-ray two-view:  FINDINGS: Normal heart, mediastinum and hila.  Mild linear subsegmental atelectasis at the lung bases. Lungs otherwise clear.  No pleural effusion or pneumothorax.  Skeletal structures are unremarkable.  IMPRESSION: No active cardiopulmonary  disease. __________________________________________  PROCEDURES  Procedure(s) performed: None  Critical Care performed: None  ____________________________________________   ED COURSE / ASSESSMENT AND PLAN  Pertinent labs & imaging results that were available during my care of the patient were reviewed by me and considered in my medical decision making (see chart for details).   Ms. Hanken has symptoms that are consistent with upper respiratory infection, on exam with nasal congestion and intermittent cough. She is not wheezing. She does not have a history of asthma.  I'm most suspicious of a viral URI, however given the history of the fever, and sputum production I am going to check an x-ray for pneumonia.  Although she is complaining somewhat of pain with a cough or a deep breath, this seems related to musculoskeletal from coughing more so than the pleuritic chest pain from PE. At this point I'm not suspicious for PE with no elevated pulse, and O2 sat is 100% on room air.  CXR without infiltrate, reassuring CXR.  Labs overall reassuring.  I discussed with the patient again that her constellation of symptoms from body fatigue and body aches associated with the nasal congestion and sinus drainage as well as cough seem most consistent  with viral URI. She thinks symptoms have been ongoing for about 2 weeks now which is a little unusual, however we have been seeing a URI strain in the community right now the is lasting fairly long.  In any case, her abdominal pain does not seem to be focal or localized or severe, or pelvic related, and I discussed with her that I'm not recommending advanced imaging at this point time and she is in agreement.  Her urinalysis is reassuring.  The discomfort with cough associated at her sides, seems more to be musculoskeletal rather than pleuritic with respect to the possibility of PE. We discussed at length symptoms of PE, and her constellation of  symptoms seems more likely viral and chosen not to pursue additional imaging for that at this time. Patient has had no tachycardia or hypoxia.   In terms of her mild global headache, this seems nonspecific without any neurologic symptoms or severity or vision symptoms I am recommending continued hydration, Tylenol. We discussed not taking ibuprofen while pregnant.  I did look up and see recommendations for cough medication on Up-to-Date regarding ok for over-the-counter Robitussin DM as needed for short course.  And also follow closely with her primary care doctor, or certainly follow here for any worsening condition.     CONSULTATIONS:  Dr. Feliberto Gottron - discussed regarding patient complained of decreased fetal activity. Patient to be sent up to [facility] triage   Patient / Family / Caregiver informed of clinical course, medical decision-making process, and agree with plan.   I discussed return precautions, follow-up instructions, and discharge instructions with patient and/or family.  ___________________________________________   FINAL CLINICAL IMPRESSION(S) / ED DIAGNOSES   Final diagnoses:  Viral URI with cough              Note: This dictation was prepared with Dragon dictation. Any transcriptional errors that result from this process are unintentional    Governor Rooks, MD 01/03/17 1048    Governor Rooks, MD 01/03/17 1137

## 2017-01-03 NOTE — ED Notes (Signed)
Ambulated patient on room air.  O2 Sats stayed 100%.  No SOB/ DOE.  Patient tolerated well.

## 2017-01-03 NOTE — Discharge Summary (Signed)
Schermerhorn, Ihor Austin, MD  Obstetrics    [] Hide copied text [] Hover for attribution information Patient ID: Erin Hartman, female   DOB: Jul 14, 1985, 31 y.o.   MRN: [id number]  Erin Hartman is a 31 y.o. female. She is at [redacted]w[redacted]d gestation. No LMP recorded. Patient is pregnant. Estimated Date of Delivery: 03/30/17  Prenatal care site:  [facility] Chief complaint:  Location: Onset/timing: Duration: Quality:  Severity: Aggravating or alleviating conditions: Associated signs/symptoms: Context:  S: Resting comfortably. noCTX, no VB.no LOF,  Seen in ED for Viral URI symptoms and c/o decreased fetal movt . Normally get care at [facility] with [facility] ED - pt is a [facility] pt   Maternal Medical History:       Past Medical History:  Diagnosis Date  . Medical history non-contributory          Past Surgical History:  Procedure Laterality Date  . NO PAST SURGERIES      No Known Allergies         Prior to Admission medications   Medication Sig Start Date End Date Taking? Authorizing Provider  acetaminophen (TYLENOL) 325 MG tablet Take 650 mg by mouth every 6 (six) hours as needed.   Yes [provider]  ibuprofen (ADVIL,MOTRIN) 600 MG tablet Take 1 tablet (600 mg total) by mouth every 6 (six) hours. 05/10/15  Yes Christeen Douglas, MD  Prenatal Vit-Fe Fumarate-FA (PRENATAL MULTIVITAMIN) TABS tablet Take 1 tablet by mouth daily at 12 noon.   Yes [provider]     Social History: She  reports that she has never smoked. She has never used smokeless tobacco. She reports that she does not drink alcohol or use drugs.  Family History: family history is not on file.  no history of gyn cancers  Review of Systems: A full review of systems was performed and negative except as noted in the HPI.   Eyes: no vision change  Endocrine : no thyroid dysfunction  Pulm : no SOB  CV : so chest pain  GI: no  abd bloating , no blood in stool  GYN: no PID  M/s : no muscle weakness Neuro: no seizure activity   O:  BP 114/74 (BP Location: Left Arm)   Pulse 80   Temp 98.3 F (36.8 C) (Oral)   Resp 16   Ht 5\' 1"  (1.549 m)   Wt 61.2 kg (135 lb)   SpO2 100%   BMI 25.51 kg/m       Results for orders placed or performed during the hospital encounter of 01/03/17 (from the past 48 hour(s))  Mononucleosis screen   Collection Time: 01/03/17  9:58 AM  Result Value Ref Range   Mono Screen NEGATIVE NEGATIVE  Comprehensive metabolic panel   Collection Time: 01/03/17  9:58 AM  Result Value Ref Range   Sodium 137 135 - 145 mmol/L   Potassium 3.5 3.5 - 5.1 mmol/L   Chloride 105 101 - 111 mmol/L   CO2 25 22 - 32 mmol/L   Glucose, Bld 85 65 - 99 mg/dL   BUN 7 6 - 20 mg/dL   Creatinine, Ser 4.09 0.44 - 1.00 mg/dL   Calcium 8.7 (L) 8.9 - 10.3 mg/dL   Total Protein 6.8 6.5 - 8.1 g/dL   Albumin 3.0 (L) 3.5 - 5.0 g/dL   AST 14 (L) 15 - 41 U/L   ALT 9 (L) 14 - 54 U/L   Alkaline Phosphatase 76  38 - 126 U/L   Total Bilirubin 0.4 0.3 - 1.2 mg/dL   GFR calc non Af Amer >60 >60 mL/min   GFR calc Af Amer >60 >60 mL/min   Anion gap 7 5 - 15  CBC with Differential   Collection Time: 01/03/17  9:58 AM  Result Value Ref Range   WBC 11.0 3.6 - 11.0 K/uL   RBC 3.73 (L) 3.80 - 5.20 MIL/uL   Hemoglobin 10.5 (L) 12.0 - 16.0 g/dL   HCT 08.8 (L) 11.0 - 31.5 %   MCV 86.1 80.0 - 100.0 fL   MCH 28.3 26.0 - 34.0 pg   MCHC 32.8 32.0 - 36.0 g/dL   RDW 94.5 85.9 - 29.2 %   Platelets 242 150 - 440 K/uL   Neutrophils Relative % 79 %   Neutro Abs 8.6 (H) 1.4 - 6.5 K/uL   Lymphocytes Relative 14 %   Lymphs Abs 1.6 1.0 - 3.6 K/uL   Monocytes Relative 7 %   Monocytes Absolute 0.8 0.2 - 0.9 K/uL   Eosinophils Relative 0 %   Eosinophils Absolute 0.0 0 - 0.7 K/uL   Basophils Relative 0 %   Basophils Absolute 0.0 0 - 0.1 K/uL  Urinalysis, Complete w Microscopic   Collection  Time: 01/03/17  9:58 AM  Result Value Ref Range   Color, Urine YELLOW (A) YELLOW   APPearance CLEAR (A) CLEAR   Specific Gravity, Urine 1.014 1.005 - 1.030   pH 7.0 5.0 - 8.0   Glucose, UA NEGATIVE NEGATIVE mg/dL   Hgb urine dipstick NEGATIVE NEGATIVE   Bilirubin Urine NEGATIVE NEGATIVE   Ketones, ur 20 (A) NEGATIVE mg/dL   Protein, ur NEGATIVE NEGATIVE mg/dL   Nitrite NEGATIVE NEGATIVE   Leukocytes, UA MODERATE (A) NEGATIVE   RBC / HPF 0-5 0 - 5 RBC/hpf   WBC, UA 0-5 0 - 5 WBC/hpf   Bacteria, UA NONE SEEN NONE SEEN   Squamous Epithelial / LPF 0-5 (A) NONE SEEN   Mucous PRESENT   POCT rapid strep A [facility])   Collection Time: 01/03/17 10:18 AM  Result Value Ref Range   Streptococcus, Group A Screen (Direct) NEGATIVE NEGATIVE   Constitutional: NAD, AAOx3  HE/ENT: extraocular movements grossly intact, moist mucous membranes CV: RRR PULM: nl respiratory effort, CTABL                                         Abd: gravid, non-tender, non-distended, soft                                                  Ext: Non-tender, Nonedmeatous                     Psych: mood appropriate, speech normal Pelvic deferred  NST: REACTIVE  Baseline: 130  Variability: moderate Accelerations present x >2 Decelerations absent Time    A/P: 31 y.o. [redacted]w[redacted]d here for antenatal surveillance for viral URI decreased fetal mo  reassuring fetal monitoring    Labor: not present.   Fetal Wellbeing: Reassuring Cat 1 tracing.  Reactive NST   D/c home stable, precautions reviewed, follow-up as scheduled.  Symptomatic care for URI  ----- Suzy Bouchard , MD Attending Obstetrician and  Gynecologist [facility], Department of OB/GYN

## 2017-01-03 NOTE — ED Notes (Signed)
AAOx3. Skin warm and dry.  NAD.  TO L+D for fetal monitoring.

## 2017-01-03 NOTE — ED Triage Notes (Signed)
C/O two weeks of cough and cold symptoms.  Last week started having a fever and bones aching from coughing so much.  Cough is productive.  This week reports less fetal movement.  Patient states she has been taking ibuprofen and tylenol at home.  P2 G1  EDC 03/30/17.

## 2017-01-03 NOTE — ED Notes (Signed)
Patient denies pain and is resting comfortably.  

## 2017-01-03 NOTE — Progress Notes (Signed)
Patient ID: Arian Mcquitty, female   DOB: 19-Dec-1985, 31 y.o.   MRN: [id number]  Orlando Thalmann is a 31 y.o. female. She is at [redacted]w[redacted]d gestation. No LMP recorded. Patient is pregnant. Estimated Date of Delivery: 03/30/17  Prenatal care site:  [facility] Chief complaint:  Location: Onset/timing: Duration: Quality:  Severity: Aggravating or alleviating conditions: Associated signs/symptoms: Context:  S: Resting comfortably. no CTX, no VB.no LOF,  Seen in ED for Viral URI symptoms and c/o decreased fetal movt . Normally get care at [facility] with delivery plans at [facility] from [facility] ED - pt is a [facility] pt   Maternal Medical History:   Past Medical History:  Diagnosis Date  . Medical history non-contributory     Past Surgical History:  Procedure Laterality Date  . NO PAST SURGERIES      No Known Allergies  Prior to Admission medications   Medication Sig Start Date End Date Taking? Authorizing Provider  acetaminophen (TYLENOL) 325 MG tablet Take 650 mg by mouth every 6 (six) hours as needed.   Yes [provider]  ibuprofen (ADVIL,MOTRIN) 600 MG tablet Take 1 tablet (600 mg total) by mouth every 6 (six) hours. 05/10/15  Yes Christeen Douglas, MD  Prenatal Vit-Fe Fumarate-FA (PRENATAL MULTIVITAMIN) TABS tablet Take 1 tablet by mouth daily at 12 noon.   Yes [provider]     Social History: She  reports that she has never smoked. She has never used smokeless tobacco. She reports that she does not drink alcohol or use drugs.  Family History: family history is not on file.  no history of gyn cancers  Review of Systems: A full review of systems was performed and negative except as noted in the HPI.   Eyes: no vision change  Endocrine : no thyroid dysfunction  Pulm : no SOB  CV : so chest pain  GI: no abd bloating , no blood in stool  GYN: no PID  M/s : no muscle weakness Neuro: no seizure activity   O:  BP 114/74 (BP Location: Left Arm)    Pulse 80   Temp 98.3 F (36.8 C) (Oral)   Resp 16   Ht 5\' 1"  (1.549 m)   Wt 61.2 kg (135 lb)   SpO2 100%   BMI 25.51 kg/m  Results for orders placed or performed during the hospital encounter of 01/03/17 (from the past 48 hour(s))  Mononucleosis screen   Collection Time: 01/03/17  9:58 AM  Result Value Ref Range   Mono Screen NEGATIVE NEGATIVE  Comprehensive metabolic panel   Collection Time: 01/03/17  9:58 AM  Result Value Ref Range   Sodium 137 135 - 145 mmol/L   Potassium 3.5 3.5 - 5.1 mmol/L   Chloride 105 101 - 111 mmol/L   CO2 25 22 - 32 mmol/L   Glucose, Bld 85 65 - 99 mg/dL   BUN 7 6 - 20 mg/dL   Creatinine, Ser 4.09 0.44 - 1.00 mg/dL   Calcium 8.7 (L) 8.9 - 10.3 mg/dL   Total Protein 6.8 6.5 - 8.1 g/dL   Albumin 3.0 (L) 3.5 - 5.0 g/dL   AST 14 (L) 15 - 41 U/L   ALT 9 (L) 14 - 54 U/L   Alkaline Phosphatase 76 38 - 126 U/L   Total Bilirubin 0.4 0.3 - 1.2 mg/dL   GFR calc non Af Amer >60 >60 mL/min   GFR calc Af Amer >60 >60 mL/min   Anion gap  7 5 - 15  CBC with Differential   Collection Time: 01/03/17  9:58 AM  Result Value Ref Range   WBC 11.0 3.6 - 11.0 K/uL   RBC 3.73 (L) 3.80 - 5.20 MIL/uL   Hemoglobin 10.5 (L) 12.0 - 16.0 g/dL   HCT 48.5 (L) 46.2 - 70.3 %   MCV 86.1 80.0 - 100.0 fL   MCH 28.3 26.0 - 34.0 pg   MCHC 32.8 32.0 - 36.0 g/dL   RDW 50.0 93.8 - 18.2 %   Platelets 242 150 - 440 K/uL   Neutrophils Relative % 79 %   Neutro Abs 8.6 (H) 1.4 - 6.5 K/uL   Lymphocytes Relative 14 %   Lymphs Abs 1.6 1.0 - 3.6 K/uL   Monocytes Relative 7 %   Monocytes Absolute 0.8 0.2 - 0.9 K/uL   Eosinophils Relative 0 %   Eosinophils Absolute 0.0 0 - 0.7 K/uL   Basophils Relative 0 %   Basophils Absolute 0.0 0 - 0.1 K/uL  Urinalysis, Complete w Microscopic   Collection Time: 01/03/17  9:58 AM  Result Value Ref Range   Color, Urine YELLOW (A) YELLOW   APPearance CLEAR (A) CLEAR   Specific Gravity, Urine 1.014 1.005 - 1.030   pH 7.0 5.0 - 8.0   Glucose, UA  NEGATIVE NEGATIVE mg/dL   Hgb urine dipstick NEGATIVE NEGATIVE   Bilirubin Urine NEGATIVE NEGATIVE   Ketones, ur 20 (A) NEGATIVE mg/dL   Protein, ur NEGATIVE NEGATIVE mg/dL   Nitrite NEGATIVE NEGATIVE   Leukocytes, UA MODERATE (A) NEGATIVE   RBC / HPF 0-5 0 - 5 RBC/hpf   WBC, UA 0-5 0 - 5 WBC/hpf   Bacteria, UA NONE SEEN NONE SEEN   Squamous Epithelial / LPF 0-5 (A) NONE SEEN   Mucous PRESENT   POCT rapid strep A [facility])   Collection Time: 01/03/17 10:18 AM  Result Value Ref Range   Streptococcus, Group A Screen (Direct) NEGATIVE NEGATIVE     Constitutional: NAD, AAOx3  HE/ENT: extraocular movements grossly intact, moist mucous membranes CV: RRR PULM: nl respiratory effort, CTABL     Abd: gravid, non-tender, non-distended, soft      Ext: Non-tender, Nonedmeatous   Psych: mood appropriate, speech normal Pelvic deferred  NST: REACTIVE  Baseline: 130  Variability: moderate Accelerations present x >2 Decelerations absent Time    A/P: 31 y.o. [redacted]w[redacted]d here for antenatal surveillance for viral URI decreased fetal mo  reassuring fetal monitoring    Labor: not present.   Fetal Wellbeing: Reassuring Cat 1 tracing.  Reactive NST   D/c home stable, precautions reviewed, follow-up as scheduled.  Symptomatic care for URI  ----- Suzy Bouchard , MD Attending Obstetrician and Gynecologist [facility], Department of OB/GYN [facility]

## 2017-01-03 NOTE — ED Notes (Signed)
AAOx3.  Skin warm and dry.  NAD 

## 2017-06-22 ENCOUNTER — Encounter (HOSPITAL_COMMUNITY): Payer: Self-pay

## 2018-10-15 ENCOUNTER — Telehealth: Payer: Self-pay

## 2018-10-15 ENCOUNTER — Other Ambulatory Visit: Payer: Self-pay

## 2018-10-15 DIAGNOSIS — Z20822 Contact with and (suspected) exposure to covid-19: Secondary | ICD-10-CM

## 2018-10-15 NOTE — Telephone Encounter (Signed)
Dr. Teodoro Kil requesting COVID 19 test.

## 2018-10-17 LAB — NOVEL CORONAVIRUS, NAA: SARS-CoV-2, NAA: NOT DETECTED

## 2019-04-05 ENCOUNTER — Emergency Department: Payer: Self-pay

## 2019-04-05 ENCOUNTER — Other Ambulatory Visit: Payer: Self-pay

## 2019-04-05 ENCOUNTER — Emergency Department
Admission: EM | Admit: 2019-04-05 | Discharge: 2019-04-05 | Disposition: A | Discharge status: Home / Self Care | Payer: Self-pay | Attending: Emergency Medicine | Admitting: Emergency Medicine

## 2019-04-05 ENCOUNTER — Encounter: Payer: Self-pay | Admitting: Emergency Medicine

## 2019-04-05 DIAGNOSIS — Y999 Unspecified external cause status: Secondary | ICD-10-CM | POA: Insufficient documentation

## 2019-04-05 DIAGNOSIS — S92355A Nondisplaced fracture of fifth metatarsal bone, left foot, initial encounter for closed fracture: Secondary | ICD-10-CM

## 2019-04-05 DIAGNOSIS — X501XXA Overexertion from prolonged static or awkward postures, initial encounter: Secondary | ICD-10-CM | POA: Insufficient documentation

## 2019-04-05 DIAGNOSIS — S92335A Nondisplaced fracture of third metatarsal bone, left foot, initial encounter for closed fracture: Secondary | ICD-10-CM | POA: Insufficient documentation

## 2019-04-05 DIAGNOSIS — Y9301 Activity, walking, marching and hiking: Secondary | ICD-10-CM | POA: Insufficient documentation

## 2019-04-05 DIAGNOSIS — Y929 Unspecified place or not applicable: Secondary | ICD-10-CM | POA: Insufficient documentation

## 2019-04-05 MED ORDER — TRAMADOL HCL 50 MG PO TABS: 50.0000 mg | ORAL_TABLET | Freq: Four times a day (QID) | ORAL | 0 refills | Status: AC | PRN

## 2019-04-05 MED ORDER — IBUPROFEN 600 MG PO TABS: 600.0000 mg | ORAL_TABLET | Freq: Four times a day (QID) | ORAL | 0 refills | Status: AC | PRN

## 2019-04-05 NOTE — Discharge Instructions (Signed)
You have a fracture in your foot.  Please wear Ace wrap and postop shoe.  Use crutches.  Ice and elevate foot today.  You can take ibuprofen for pain and inflammation and tramadol for extreme pain.  Please follow-up with podiatry.

## 2019-04-05 NOTE — ED Triage Notes (Signed)
PT had fall last night , c/o LFT ankle pain, swelling noted.

## 2019-04-05 NOTE — ED Notes (Signed)
See triage note  States she fell yesterday having pain to left ankle   Ankle swollen and tender  Good pulses

## 2019-04-05 NOTE — ED Provider Notes (Signed)
[facility] Emergency Department Provider Note  ____________________________________________  Time seen: Approximately 1:02 PM  I have reviewed the triage vital signs and the nursing notes.   HISTORY  Chief Complaint No chief complaint on file.    HPI Erin Hartman is a 33 y.o. female that presents to the emergency department for evaluation of left foot pain after injury yesterday. She was taking a step while holding her baby and rolled her foot in some water. She has been walking on foot since and hoping that pain and swelling would improve.It has not, however. No additional injuries. She did not hit her head. No numbness, tingling,   Past Medical History:  Diagnosis Date  . Medical history non-contributory     Patient Active Problem List   Diagnosis Date Noted  . Decreased fetal movement 01/03/2017  . Active labor at term 05/08/2015  . Active labor 05/08/2015    Past Surgical History:  Procedure Laterality Date  . NO PAST SURGERIES      Prior to Admission medications   Medication Sig Start Date End Date Taking? Authorizing Provider  ibuprofen (ADVIL) 600 MG tablet Take 1 tablet (600 mg total) by mouth every 6 (six) hours as needed. 04/05/19   Laban Emperor, PA-C  traMADol (ULTRAM) 50 MG tablet Take 1 tablet (50 mg total) by mouth every 6 (six) hours as needed for up to 3 days. 04/05/19 04/08/19  Laban Emperor, PA-C    Allergies Patient has no known allergies.  No family history on file.  Social History Social History   Tobacco Use  . Smoking status: Never Smoker  . Smokeless tobacco: Never Used  Substance Use Topics  . Alcohol use: No  . Drug use: No     Review of Systems  Cardiovascular: No chest pain. Respiratory: No SOB. Gastrointestinal: No nausea, no vomiting.  Musculoskeletal: Positive for foot pain. Skin: Negative for rash, abrasions, lacerations. Positive for swelling and ecchymosis to left  foot. Neurological: Negative for headaches, numbness or tingling   ____________________________________________   PHYSICAL EXAM:  VITAL SIGNS: ED Triage Vitals  Enc Vitals Group     BP 04/05/19 1212 123/81     Pulse Rate 04/05/19 1212 91     Resp 04/05/19 1212 16     Temp 04/05/19 1212 98.1 F (36.7 C)     Temp Source 04/05/19 1212 Oral     SpO2 04/05/19 1212 98 %     Weight --      Height --      Head Circumference --      Peak Flow --      Pain Score 04/05/19 1213 8     Pain Loc --      Pain Edu? --      Excl. in [place]? --      Constitutional: Alert and oriented. Well appearing and in no acute distress. Eyes: Conjunctivae are normal. PERRL. EOMI. Head: Atraumatic. ENT:      Ears:      Nose: No congestion/rhinnorhea.      Mouth/Throat: Mucous membranes are moist.  Neck: No stridor.  Cardiovascular: Normal rate, regular rhythm.  Good peripheral circulation. Symmetric pedal pulses bilaterally. Respiratory: Normal respiratory effort without tachypnea or retractions. Lungs CTAB. Good air entry to the bases with no decreased or absent breath sounds. Musculoskeletal: Full range of motion to all extremities. No gross deformities appreciated. Swelling and ecchymosis to lateral left foot. Neurologic:  Normal speech and language. No gross focal neurologic deficits  are appreciated.  Skin:  Skin is warm, dry and intact. No rash noted. Psychiatric: Mood and affect are normal. Speech and behavior are normal. Patient exhibits appropriate insight and judgement.   ____________________________________________   LABS (all labs ordered are listed, but only abnormal results are displayed)  Labs Reviewed - No data to display ____________________________________________  EKG   ____________________________________________  RADIOLOGY Lexine Baton, personally viewed and evaluated these images (plain radiographs) as part of my medical decision making, as well as reviewing the  written report by the radiologist.  Dg Foot Complete Left  Result Date: 04/05/2019 CLINICAL DATA:  Left foot pain and swelling since a fall last night. EXAM: LEFT FOOT - COMPLETE 3+ VIEW COMPARISON:  None. FINDINGS: There is a nondisplaced transverse fracture through the base of the fifth metatarsal. The bones and joints of the left foot are otherwise normal. IMPRESSION: Nondisplaced transverse fracture through the base of the fifth metatarsal. Electronically Signed   By: Francene Boyers M.D.   On: 04/05/2019 12:58    ____________________________________________    PROCEDURES  Procedure(s) performed:    Procedures    Medications - No data to display   ____________________________________________   INITIAL IMPRESSION / ASSESSMENT AND PLAN / ED COURSE  Pertinent labs & imaging results that were available during my care of the patient were reviewed by me and considered in my medical decision making (see chart for details).  Review of the [place] CSRS was performed in accordance of the [facility] prior to dispensing any controlled drugs.   Patient's diagnosis is consistent with 5th metatarsal fracture.  Vital signs and exam are reassuring.  X-ray consistent with nondisplaced fracture. Xray report was given. Ace wrap was applied, postop shoe was placed.  Crutches were given.  Patient will be discharged home with prescriptions for ibuprofen and a short course of tramadol.  Patient is not breast-feeding.  Patient is to follow up with podiatry as directed. Referral was given. Patient is given ED precautions to return to the ED for any worsening or new symptoms.   Erin Hartman was evaluated in Emergency Department on 04/05/2019 for the symptoms described in the history of present illness. She was evaluated in the context of the global COVID-19 pandemic, which necessitated consideration that the patient might be at risk for infection with the SARS-CoV-2 virus that causes COVID-19.  Institutional protocols and algorithms that pertain to the evaluation of patients at risk for COVID-19 are in a state of rapid change based on information released by regulatory bodies including the CDC and federal and state organizations. These policies and algorithms were followed during the patient's care in the ED.  ____________________________________________  FINAL CLINICAL IMPRESSION(S) / ED DIAGNOSES  Final diagnoses:  Nondisplaced fracture of fifth metatarsal bone, left foot, initial encounter for closed fracture      NEW MEDICATIONS STARTED DURING THIS VISIT:  ED Discharge Orders         Ordered    traMADol (ULTRAM) 50 MG tablet  Every 6 hours PRN     04/05/19 1347    ibuprofen (ADVIL) 600 MG tablet  Every 6 hours PRN     04/05/19 1347              This chart was dictated using voice recognition software/Dragon. Despite best efforts to proofread, errors can occur which can change the meaning. Any change was purely unintentional.    Enid Derry, PA-C 04/05/19 1508    Concha Se, MD 04/06/19 1226

## 2021-04-26 ENCOUNTER — Emergency Department
Admission: EM | Admit: 2021-04-26 | Discharge: 2021-04-26 | Disposition: A | Discharge status: Home / Self Care | Payer: Self-pay | Attending: Emergency Medicine | Admitting: Emergency Medicine

## 2021-04-26 ENCOUNTER — Other Ambulatory Visit: Payer: Self-pay

## 2021-04-26 ENCOUNTER — Encounter: Payer: Self-pay | Admitting: Emergency Medicine

## 2021-04-26 DIAGNOSIS — N6011 Diffuse cystic mastopathy of right breast: Secondary | ICD-10-CM | POA: Insufficient documentation

## 2021-04-26 DIAGNOSIS — N644 Mastodynia: Secondary | ICD-10-CM

## 2021-04-26 NOTE — ED Provider Notes (Signed)
[facility] Emergency Department Provider Note ____________________________________________  Time seen: 1704  I have reviewed the triage vital signs and the nursing notes.  HISTORY  Chief Complaint  Breast Pain  HPI Erin Hartman is a 35 y.o. female presents to the ED with 2 months of intermittent breast pain.  Patient reports intermittent sharp stabbing pain to the breast tissue bilaterally, but over the last couple days have had increasing episodes of the left breast.  She denies any recent trauma or injury.  She also denies any skin changes, erythema, swelling, warmth, or signs of infection.  She denies any nipple discharge or bleeding from the nipples.  Patient denies any history of breast cancer.  She is not currently breast-feeding and not reporting any recent pregnancies.  Past Medical History:  Diagnosis Date   Medical history non-contributory     Patient Active Problem List   Diagnosis Date Noted   Decreased fetal movement 01/03/2017   Active labor at term 05/08/2015   Active labor 05/08/2015    Past Surgical History:  Procedure Laterality Date   NO PAST SURGERIES      Prior to Admission medications   Medication Sig Start Date End Date Taking? Authorizing Provider  ibuprofen (ADVIL) 600 MG tablet Take 1 tablet (600 mg total) by mouth every 6 (six) hours as needed. 04/05/19   Enid Derry, PA-C    Allergies Patient has no known allergies.  History reviewed. No pertinent family history.  Social History Social History   Tobacco Use   Smoking status: Never   Smokeless tobacco: Never  Substance Use Topics   Alcohol use: No   Drug use: No    Review of Systems  Constitutional: Negative for fever. Eyes: Negative for visual changes. ENT: Negative for sore throat. Cardiovascular: Negative for chest pain. Respiratory: Negative for shortness of breath. Gastrointestinal: Negative for abdominal pain, vomiting and  diarrhea. Genitourinary: Negative for dysuria. Musculoskeletal: Negative for back pain. Skin: Negative for rash.  Breast pain as above Neurological: Negative for headaches, focal weakness or numbness. ____________________________________________  PHYSICAL EXAM:  VITAL SIGNS: ED Triage Vitals  Enc Vitals Group     BP 04/26/21 1602 119/85     Pulse Rate 04/26/21 1602 63     Resp 04/26/21 1602 16     Temp 04/26/21 1602 98.4 F (36.9 C)     Temp Source 04/26/21 1602 Oral     SpO2 04/26/21 1602 97 %     Weight 04/26/21 1607 134 lb 14.7 oz (61.2 kg)     Height 04/26/21 1607 5\' 1"  (1.549 m)     Head Circumference --      Peak Flow --      Pain Score 04/26/21 1607 8     Pain Loc --      Pain Edu? --      Excl. in [place]? --     Constitutional: Alert and oriented. Well appearing and in no distress. Head: Normocephalic and atraumatic. Eyes: Conjunctivae are normal. Normal extraocular movements Hematological/Lymphatic/Immunological: No supra-/infraclavicular, axillary lymphadenopathy. Cardiovascular: Normal rate, regular rhythm. Normal distal pulses. Respiratory: Normal respiratory effort. No wheezes/rales/rhonchi. Gastrointestinal: Soft and nontender. No distention. Musculoskeletal: Nontender with normal range of motion in all extremities.  Neurologic:  Normal gait without ataxia. Normal speech and language. No gross focal neurologic deficits are appreciated. Skin:  Skin is warm, dry and intact. No rash noted.  Bilateral breast without any skin changes, skin dimpling, erythema, induration, warmth.  No peau de d'orange  changes appreciated.  Patient is mildly tender to palpation over the left breast at the 9:00 and 6:00 positions.  Some glandular tissue palpable in the same regions.  No nipple discharge is expressed. Psychiatric: Mood and affect are normal. Patient exhibits appropriate insight and judgment. ____________________________________________    {LABS (pertinent  positives/negatives)  ____________________________________________  {EKG  ____________________________________________   RADIOLOGY Official radiology report(s): No results found. ____________________________________________  PROCEDURES   Procedures ____________________________________________   INITIAL IMPRESSION / ASSESSMENT AND PLAN / ED COURSE  As part of my medical decision making, I reviewed the following data within the electronic MEDICAL RECORD NUMBER Notes from prior ED visits   DDX: mastitis, abscess, Paget's disease, fibrocystic changes  Patient with ED evaluation of intermittent breast pain with increased CT left breast but she is evaluated focalize in ED, found to have overall reassuring exam.  No signs of any acute infectious process including abscess, cellulitis, mastitis.  Patient has some fibrocystic changes suspected to be related benign.  The breast pain she is experiencing likely represents some benign mastodynia.  Patient is otherwise encouraged to take OTC Tylenol and Motrin for symptoms.  She is further to a local community clinic so he may establish primary care home.  She may likely be further evaluated with ultrasound at that time.  Patient is reassured by the normal work-up and the likely benign symptoms she is experiencing.  Return precautions of been reviewed.  Jacari Kirsten was evaluated in Emergency Department on 04/26/2021 for the symptoms described in the history of present illness. She was evaluated in the context of the global COVID-19 pandemic, which necessitated consideration that the patient might be at risk for infection with the SARS-CoV-2 virus that causes COVID-19. Institutional protocols and algorithms that pertain to the evaluation of patients at risk for COVID-19 are in a state of rapid change based on information released by regulatory bodies including the CDC and federal and state organizations. These policies and algorithms were followed  during the patient's care in the ED. ____________________________________________  FINAL CLINICAL IMPRESSION(S) / ED DIAGNOSES  Final diagnoses:  Breast pain, left  Fibrocystic breast changes of both breasts      Karmen Stabs, Charlesetta Ivory, PA-C 04/26/21 2001    Sharman Cheek, MD 04/27/21 1646

## 2021-04-26 NOTE — ED Triage Notes (Signed)
Pt comes into the ED via POV c/o left breast pain x couple days.  Pt states it is a sharp stabbing pain.  Pt in NAD at this time.  Denies any warmth or redness to the breast.

## 2021-04-26 NOTE — Discharge Instructions (Addendum)
Your exam is overall normal. There are no signs of an infection, abscess, or pre-cancerous conditions. You may be experiencing some off-and-on breast pain due to thick breast tissue. Some women also have pain in the breasts that comes and goes. Take OTC Tylenol and Motrin as needed. Follow-up with one of the local community clinics for further evaluation. They may send you for a breast ultrasound.

## 2021-09-02 ENCOUNTER — Emergency Department: Payer: Self-pay

## 2021-09-02 ENCOUNTER — Encounter: Payer: Self-pay | Admitting: Emergency Medicine

## 2021-09-02 ENCOUNTER — Other Ambulatory Visit: Payer: Self-pay

## 2021-09-02 ENCOUNTER — Emergency Department
Admission: EM | Admit: 2021-09-02 | Discharge: 2021-09-02 | Disposition: A | Discharge status: Home / Self Care | Payer: Self-pay | Attending: Emergency Medicine | Admitting: Emergency Medicine

## 2021-09-02 DIAGNOSIS — R1011 Right upper quadrant pain: Secondary | ICD-10-CM | POA: Insufficient documentation

## 2021-09-02 LAB — COMPREHENSIVE METABOLIC PANEL
ALT: 15 U/L (ref 0–44)
AST: 17 U/L (ref 15–41)
Albumin: 4.1 g/dL (ref 3.5–5.0)
Alkaline Phosphatase: 84 U/L (ref 38–126)
Anion gap: 7 (ref 5–15)
BUN: 11 mg/dL (ref 6–20)
CO2: 24 mmol/L (ref 22–32)
Calcium: 9.1 mg/dL (ref 8.9–10.3)
Chloride: 104 mmol/L (ref 98–111)
Creatinine, Ser: 0.69 mg/dL (ref 0.44–1.00)
GFR, Estimated: >60 mL/min (ref >60)
Glucose, Bld: 92 mg/dL (ref 70–99)
Potassium: 3.5 mmol/L (ref 3.5–5.1)
Sodium: 135 mmol/L (ref 135–145)
Total Bilirubin: 1.3 mg/dL — ABNORMAL HIGH (ref 0.3–1.2)
Total Protein: 7.7 g/dL (ref 6.5–8.1)

## 2021-09-02 LAB — URINALYSIS, ROUTINE W REFLEX MICROSCOPIC
Bilirubin Urine: NEGATIVE
Glucose, UA: NEGATIVE mg/dL
Ketones, ur: NEGATIVE mg/dL
Nitrite: NEGATIVE
Protein, ur: NEGATIVE mg/dL
Specific Gravity, Urine: 1.018 (ref 1.005–1.030)
pH: 5 (ref 5.0–8.0)

## 2021-09-02 LAB — LIPASE, BLOOD: Lipase: 25 U/L (ref 11–51)

## 2021-09-02 LAB — CBC
HCT: 42.7 % (ref 36.0–46.0)
Hemoglobin: 14.1 g/dL (ref 12.0–15.0)
MCH: 30.3 pg (ref 26.0–34.0)
MCHC: 33 g/dL (ref 30.0–36.0)
MCV: 91.6 fL (ref 80.0–100.0)
Platelets: 239 10*3/uL (ref 150–400)
RBC: 4.66 MIL/uL (ref 3.87–5.11)
RDW: 13.3 % (ref 11.5–15.5)
WBC: 9.5 10*3/uL (ref 4.0–10.5)
nRBC: 0 % (ref 0.0–0.2)

## 2021-09-02 LAB — POC URINE PREG, ED: Preg Test, Ur: NEGATIVE

## 2021-09-02 MED ORDER — KETOROLAC TROMETHAMINE 30 MG/ML IJ SOLN
30.0000 mg | Freq: Once | INTRAMUSCULAR | Status: AC
  Administered 2021-09-02: 30 mg via INTRAMUSCULAR
  Filled 2021-09-02: qty 1

## 2021-09-02 MED ORDER — TRAMADOL HCL 50 MG PO TABS: 50.0000 mg | ORAL_TABLET | Freq: Four times a day (QID) | ORAL | 0 refills | Status: AC | PRN

## 2021-09-02 NOTE — ED Provider Notes (Signed)
? ?[facility] ?Provider Note ? ? ? Event Date/Time  ? First MD Initiated Contact with Patient 09/02/21 [phone]   ?  (approximate) ? ? ?History  ? ?Abdominal Pain ? ? ?HPI ? ?Erin Hartman is a 36 y.o. female with no significant past medical history who presents with complaints of right upper quadrant pain.  Patient reports last night around 11:00 she had pain in her right upper quadrant with radiation to her back.  She reports it is improved now.  She denies fevers or chills.  No nausea or vomiting.  No diarrhea.  Has not take anything for this.  No history of abdominal surgeries. ?  ? ? ?Physical Exam  ? ?Triage Vital Signs: ?ED Triage Vitals  ?Enc Vitals Group  ?   BP 09/02/21 0914 (!) 124/93  ?   Pulse Rate 09/02/21 0914 81  ?   Resp 09/02/21 0914 20  ?   Temp 09/02/21 0914 98.2 ?F (36.8 ?C)  ?   Temp Source 09/02/21 0914 Oral  ?   SpO2 09/02/21 0914 99 %  ?   Weight 09/02/21 0911 72.6 kg (160 lb)  ?   Height 09/02/21 0911 1.549 m (5\' 1" )  ?   Head Circumference --   ?   Peak Flow --   ?   Pain Score 09/02/21 0911 10  ?   Pain Loc --   ?   Pain Edu? --   ?   Excl. in [place]? --   ? ? ?Most recent vital signs: ?Vitals:  ? 09/02/21 1022 09/02/21 1214  ?BP: 120/88 120/80  ?Pulse: 80 78  ?Resp: 20 20  ?Temp:    ?SpO2: 99% 98%  ? ? ? ?General: Awake, no distress.  ?CV:  Good peripheral perfusion.  ?Resp:  Normal effort.  ?Abd:  No distention.  No tenderness palpation, reassuring exam ?Other:   ? ? ?ED Results / Procedures / Treatments  ? ?Labs ?(all labs ordered are listed, but only abnormal results are displayed) ?Labs Reviewed  ?COMPREHENSIVE METABOLIC PANEL - Abnormal; Notable for the following components:  ?    Result Value  ? Total Bilirubin 1.3 (*)   ? All other components within normal limits  ?URINALYSIS, ROUTINE W REFLEX MICROSCOPIC - Abnormal; Notable for the following components:  ? Color, Urine YELLOW (*)   ? APPearance HAZY (*)   ? Hgb urine dipstick SMALL (*)   ? Leukocytes,Ua  TRACE (*)   ? Bacteria, UA RARE (*)   ? All other components within normal limits  ?LIPASE, BLOOD  ?CBC  ?POC URINE PREG, ED  ? ? ? ?EKG ? ?ED ECG REPORT ?I, Lavonia Drafts, the attending physician, personally viewed and interpreted this ECG. ? ?Date: 09/02/2021 ? ?Rhythm: normal sinus rhythm ?QRS Axis: normal ?Intervals: normal ?ST/T Wave abnormalities: normal ?Narrative Interpretation: no evidence of acute ischemia ? ? ? ?RADIOLOGY ?Ultrasound right upper quadrant ? ? ? ?PROCEDURES: ? ?Critical Care performed:  ? ?Procedures ? ? ?MEDICATIONS ORDERED IN ED: ?Medications  ?ketorolac (TORADOL) 30 MG/ML injection 30 mg (30 mg Intramuscular Given 09/02/21 1116)  ? ? ? ?IMPRESSION / MDM / ASSESSMENT AND PLAN / ED COURSE  ?I reviewed the triage vital signs and the nursing notes. ? ? ?Patient presents with right upper quadrant abdominal pain as detailed above, now improved.  Differential includes biliary colic, gastritis ? ?Lab work is reassuring, lipase is normal.  CMP is unremarkable. ? ?Urinalysis is unremarkable. ? ?We will  send for ultrasound to evaluate for gallstones. ? ?Ultrasound negative for acute abnormality.  Patient is feeling improved after IM Toradol ? ?Sent for chest x-ray to rule out pneumonia ? ?Patient feeling well after reevaluation, appropriate for discharge with outpatient follow-up, return precautions discussed. ? ? ?  ? ? ?FINAL CLINICAL IMPRESSION(S) / ED DIAGNOSES  ? ?Final diagnoses:  ?Right upper quadrant abdominal pain  ? ? ? ?Rx / DC Orders  ? ?ED Discharge Orders   ? ?      Ordered  ?  traMADol (ULTRAM) 50 MG tablet  Every 6 hours PRN       ? 09/02/21 1207  ? ?  ?  ? ?  ? ? ? ?Note:  This document was prepared using Dragon voice recognition software and may include unintentional dictation errors. ?  ?Lavonia Drafts, MD ?09/02/21 1504 ? ?

## 2021-09-02 NOTE — ED Triage Notes (Signed)
Pt via POV from home. Pt c/o pain that started in her LUQ but now it radiates to her back and RUQ. Denies fevers. Denies NVD. States she has a strong odor to her urine. Denies any abd surgeries. Pt is A&OX4 and NAD.  ?

## 2022-04-22 ENCOUNTER — Emergency Department: Payer: Self-pay

## 2022-04-22 ENCOUNTER — Other Ambulatory Visit: Payer: Self-pay

## 2022-04-22 ENCOUNTER — Emergency Department
Admission: EM | Admit: 2022-04-22 | Discharge: 2022-04-23 | Disposition: A | Discharge status: Home / Self Care | Payer: Self-pay | Attending: Emergency Medicine | Admitting: Emergency Medicine

## 2022-04-22 DIAGNOSIS — Z1152 Encounter for screening for COVID-19: Secondary | ICD-10-CM | POA: Insufficient documentation

## 2022-04-22 DIAGNOSIS — J189 Pneumonia, unspecified organism: Secondary | ICD-10-CM | POA: Insufficient documentation

## 2022-04-22 LAB — RESP PANEL BY RT-PCR (FLU A&B, COVID) ARPGX2
Influenza A by PCR: POSITIVE — AB
Influenza B by PCR: NEGATIVE
SARS Coronavirus 2 by RT PCR: NEGATIVE

## 2022-04-22 MED ORDER — AZITHROMYCIN 250 MG PO TABS: ORAL_TABLET | ORAL | 0 refills | Status: AC

## 2022-04-22 MED ORDER — AMOXICILLIN-POT CLAVULANATE 875-125 MG PO TABS: 1.0000 | ORAL_TABLET | Freq: Two times a day (BID) | ORAL | 0 refills | Status: AC

## 2022-04-22 NOTE — ED Triage Notes (Signed)
Pt here with a cough x2 weeks. Pt states she is coughing up a lot of phlegm with some pain in her head and neck d/t the coughing. Pt denies sneezing or fever.

## 2022-04-22 NOTE — ED Provider Notes (Signed)
   [facility] Provider Note    Event Date/Time   First MD Initiated Contact with Patient 04/22/22 1727     (approximate)   History   Cough   HPI  Erin Hartman is a 36 y.o. female with no significant past medical history who presents with complaints of cough for approximately 2 weeks.  She has tried over-the-counter medications with no improvement.  Denies shortness of breath.  No history of asthma.  Does not smoke.     Physical Exam   Triage Vital Signs: ED Triage Vitals [04/22/22 1635]  Enc Vitals Group     BP 106/69     Pulse Rate 78     Resp 18     Temp 98.3 F (36.8 C)     Temp Source Oral     SpO2 96 %     Weight 72.6 kg (160 lb 0.9 oz)     Height 1.549 m (5\' 1" )     Head Circumference      Peak Flow      Pain Score 5     Pain Loc      Pain Edu?      Excl. in [place]?     Most recent vital signs: Vitals:   04/22/22 1635  BP: 106/69  Pulse: 78  Resp: 18  Temp: 98.3 F (36.8 C)  SpO2: 96%     General: Awake, no distress.  Well-appearing CV:  Good peripheral perfusion.  Resp:  Normal effort.  Clear to auscultation bilaterally Abd:  No distention.  Other:     ED Results / Procedures / Treatments   Labs (all labs ordered are listed, but only abnormal results are displayed) Labs Reviewed  RESP PANEL BY RT-PCR (FLU A&B, COVID) ARPGX2 - Abnormal; Notable for the following components:      Result Value   Influenza A by PCR POSITIVE (*)    All other components within normal limits     EKG     RADIOLOGY Chest x-ray viewed by me, left lower lobe infiltrate    PROCEDURES:  Critical Care performed:   Procedures   MEDICATIONS ORDERED IN ED: Medications - No data to display   IMPRESSION / MDM / ASSESSMENT AND PLAN / ED COURSE  I reviewed the triage vital signs and the nursing notes. Patient's presentation is most consistent with acute illness / injury with system symptoms.  Patient presents with cough  x 2 weeks, some viral syndromes as well.  Differential includes upper respiratory infection, pneumonia  Chest x-ray most consistent with pneumonia, patient is well-appearing, normal respiratory rate, normal oxygenation, appropriate for outpatient management with antibiotics.          FINAL CLINICAL IMPRESSION(S) / ED DIAGNOSES   Final diagnoses:  Community acquired pneumonia, unspecified laterality     Rx / DC Orders   ED Discharge Orders          Ordered    amoxicillin-clavulanate (AUGMENTIN) 875-125 MG tablet  2 times daily        04/22/22 1731    azithromycin (ZITHROMAX Z-PAK) 250 MG tablet        04/22/22 1731             Note:  This document was prepared using Dragon voice recognition software and may include unintentional dictation errors.   14/05/23, MD 04/26/22 [phone]

## 2022-04-22 NOTE — ED Provider Triage Note (Signed)
Emergency Medicine Provider Triage Evaluation Note  Erin Hartman , a 36 y.o. female  was evaluated in triage.  Pt complains of cough x 2 weeks not relieved by OTC medications. No fever for the past few days. No history of asthma. Does not smoke.  Physical Exam  There were no vitals taken for this visit. Gen:   Awake, no distress   Resp:  Normal effort  MSK:   Moves extremities without difficulty  Other:    Medical Decision Making  Medically screening exam initiated at 4:27 PM.  Appropriate orders placed.  Kandis Ban was informed that the remainder of the evaluation will be completed by another provider, this initial triage assessment does not replace that evaluation, and the importance of remaining in the ED until their evaluation is complete.    Chinita Pester, FNP 04/22/22 2239

## 2022-10-09 IMAGING — CR DG CHEST 2V
1 series · 2 of 2 positions shown · non-contrast
Comparison: 01/03/2017.

CLINICAL DATA: Right lower chest pain and back pain.

EXAM:
CHEST - 2 VIEW

[Series 1: dg chest 2 view · 0.14mm/px · 2 of 2 slices shown]
[im 1/2]
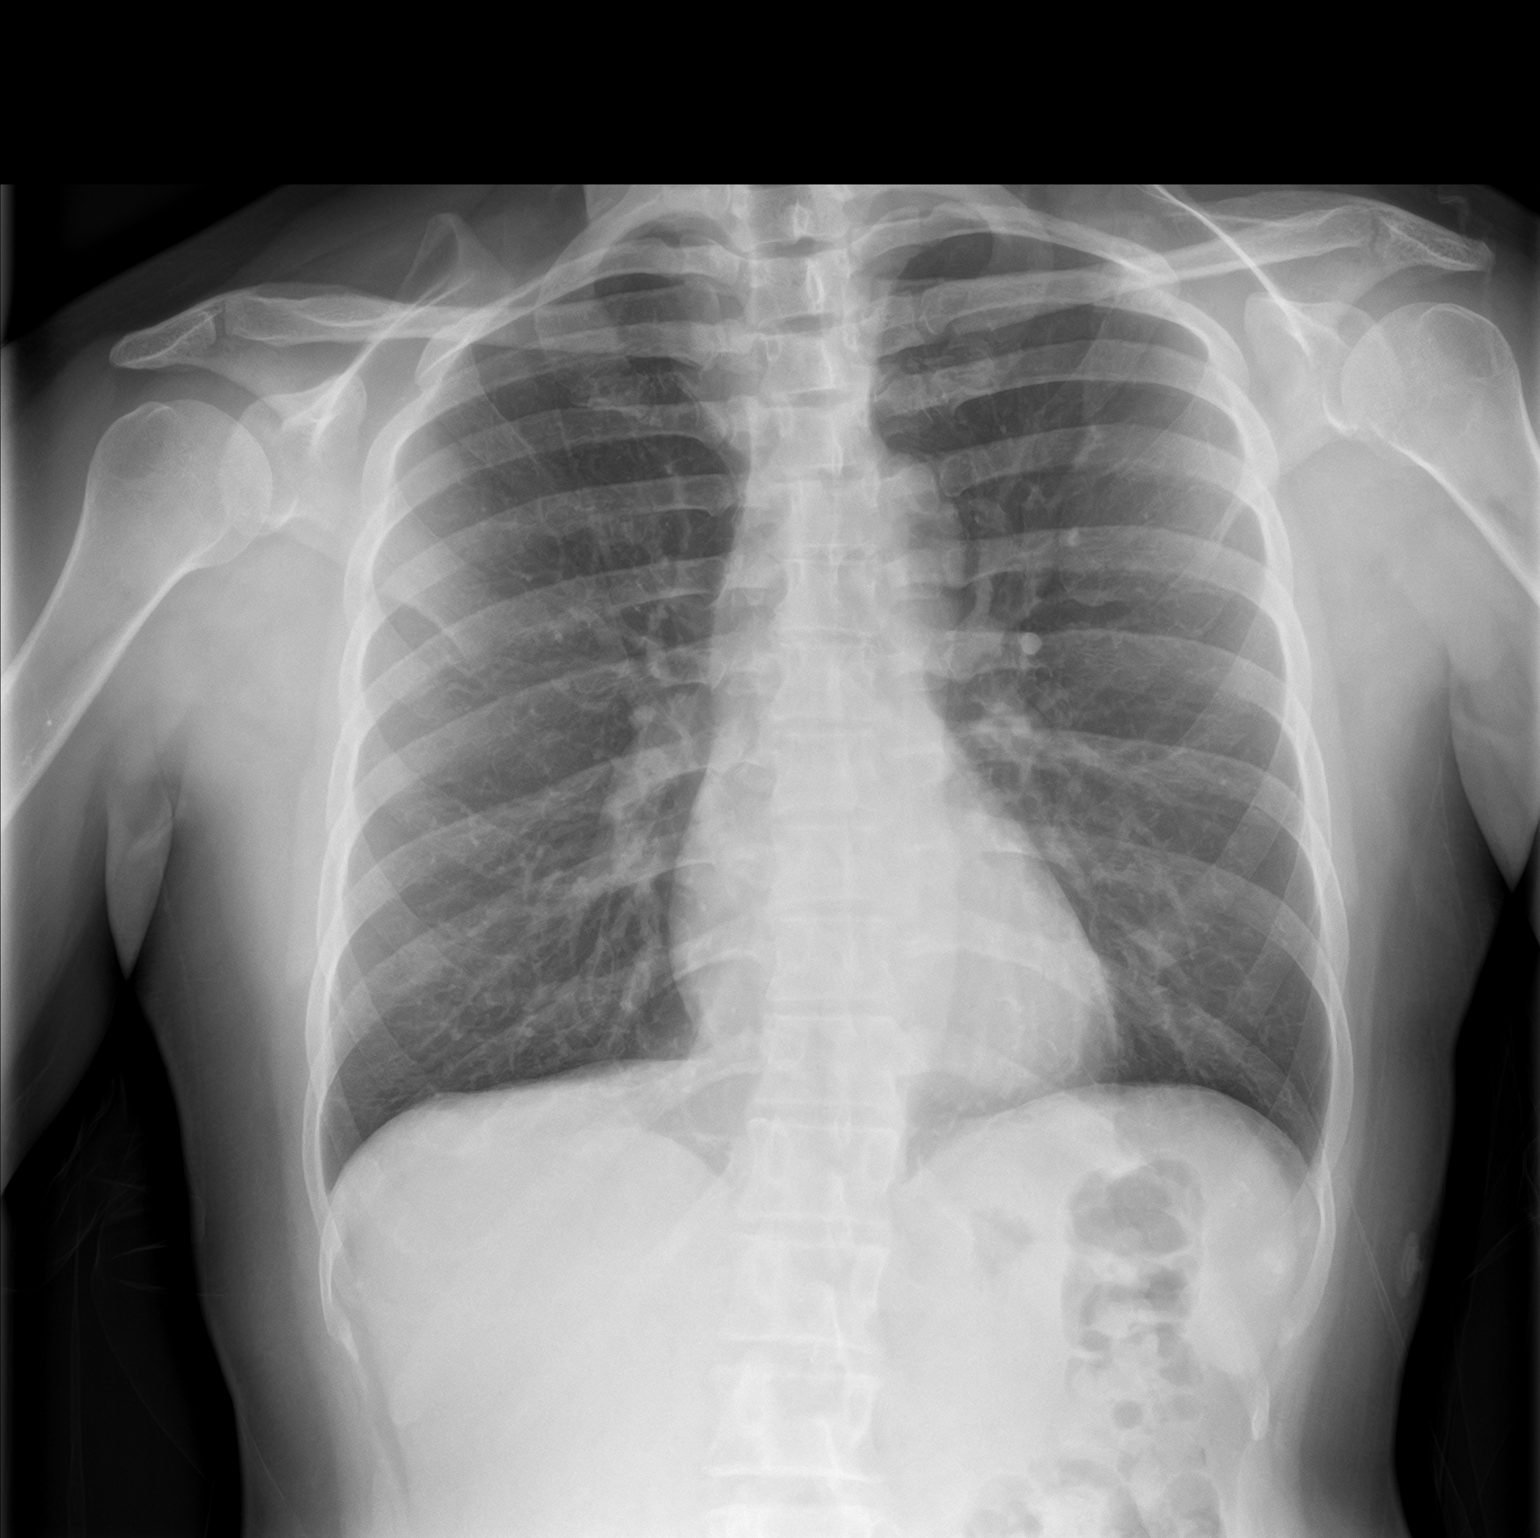
[im 2/2]
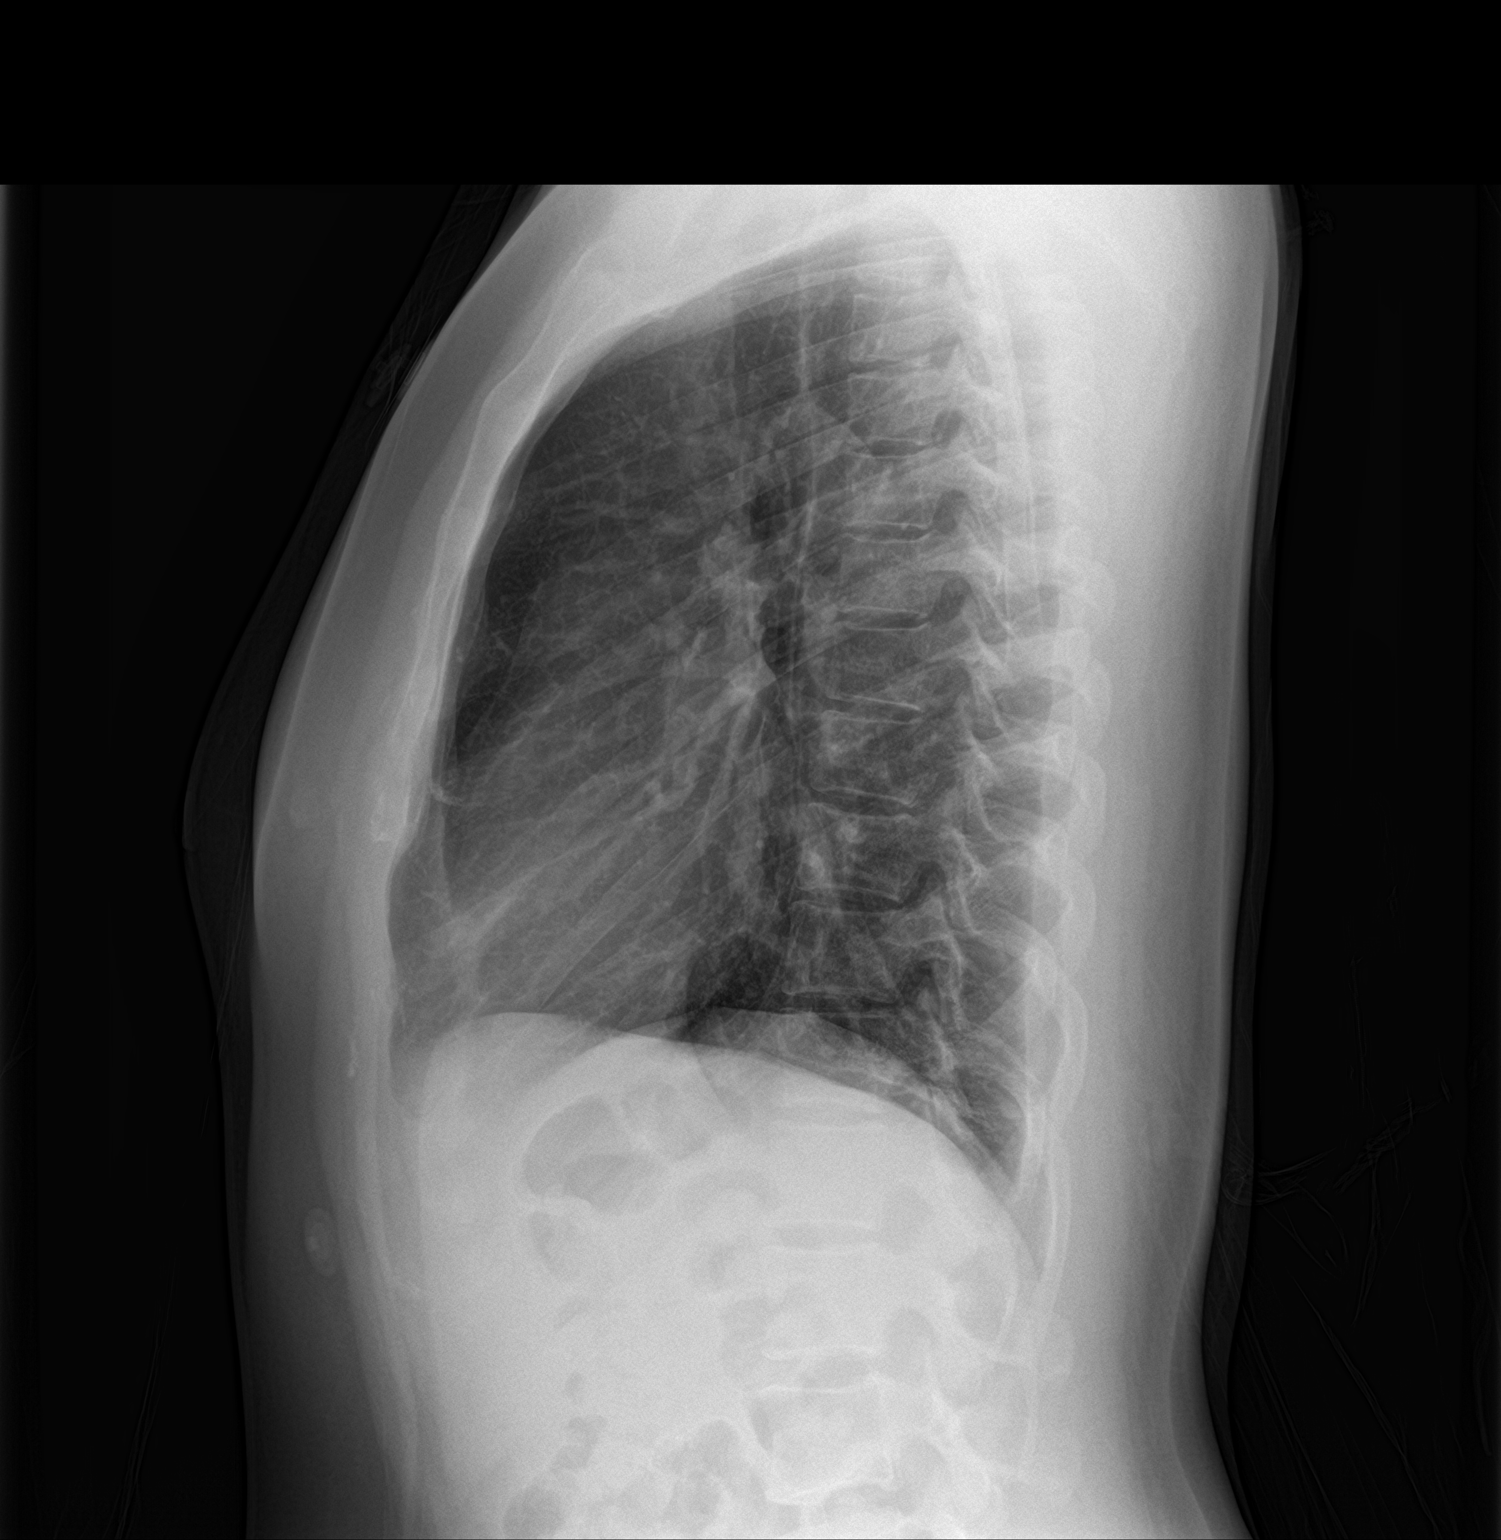

[2 of 2 positions shown; findings below may reference images not displayed]

FINDINGS: Trachea is midline. Heart size normal. Lungs are clear. No pleural
fluid.
IMPRESSION: No acute findings.

## 2022-10-09 IMAGING — US US ABDOMEN LIMITED
1 series · 14 of 25 positions shown · non-contrast
Comparison: None.

CLINICAL DATA: Acute upper abdominal pain.

EXAM:
ULTRASOUND ABDOMEN LIMITED RIGHT UPPER QUADRANT

[Series 1: us abdomen limited ruq (liver/gb) · 14 of 50 slices shown]
[im 1/50]
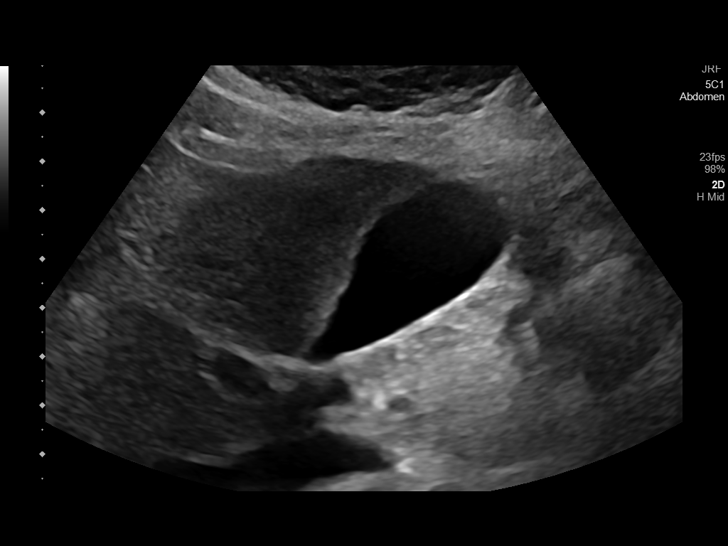
[im 5/50]
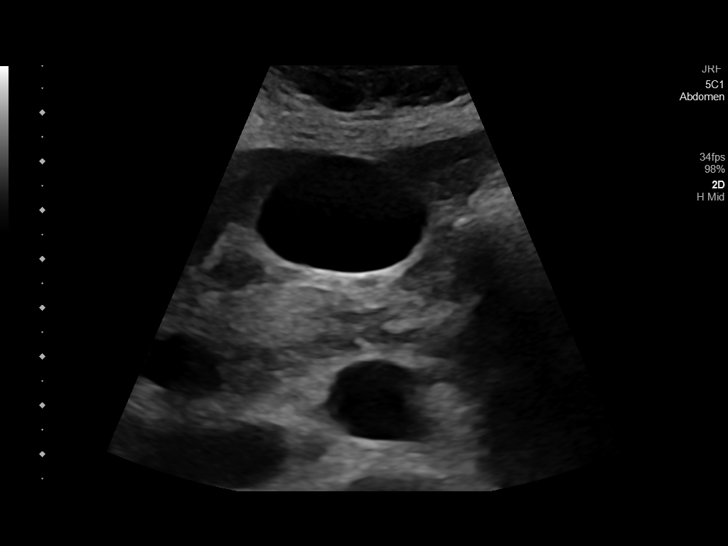
[im 9/50]
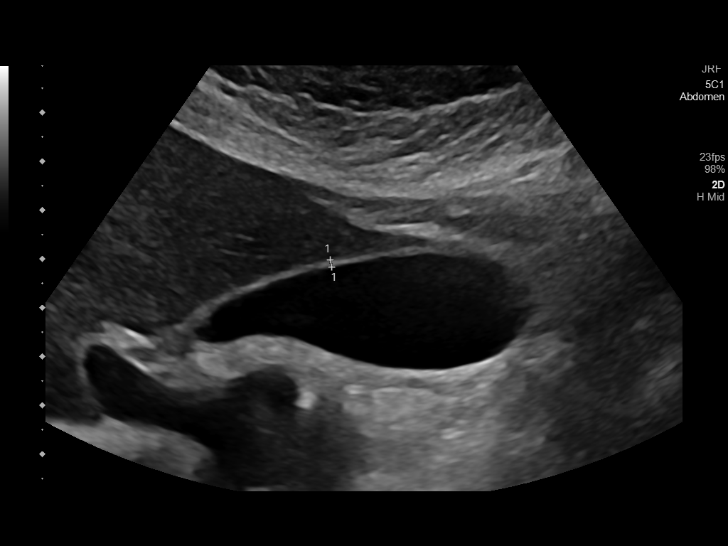
[im 13/50]
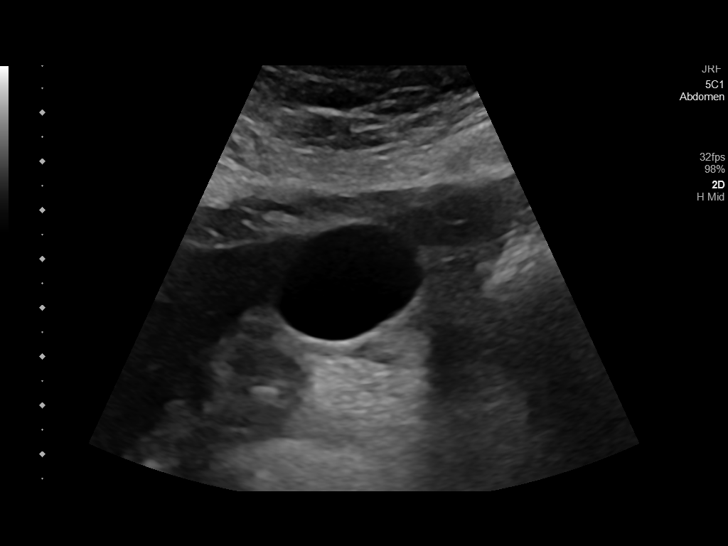
[im 17/50]
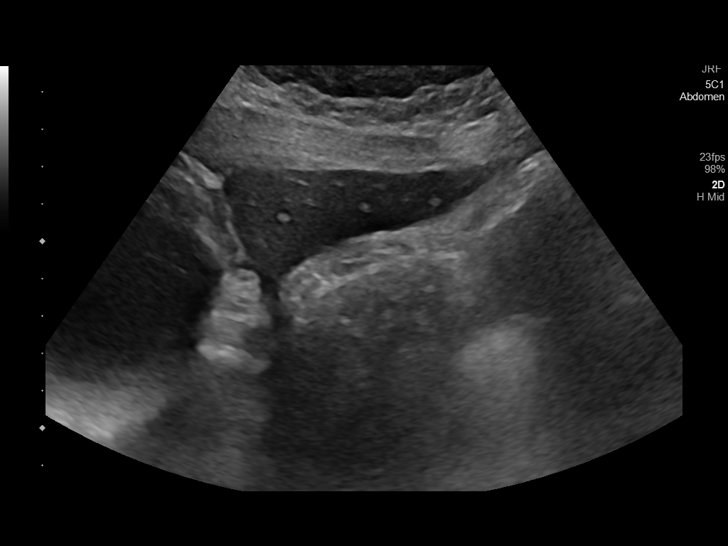
[im 19/50]
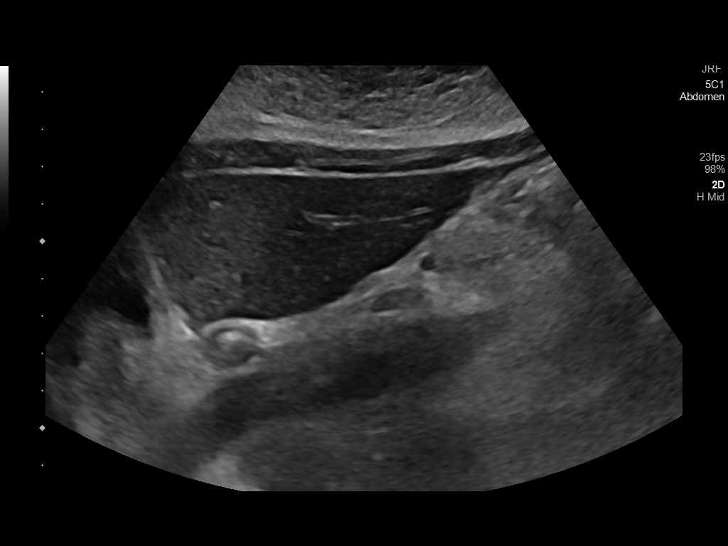
[im 23/50]
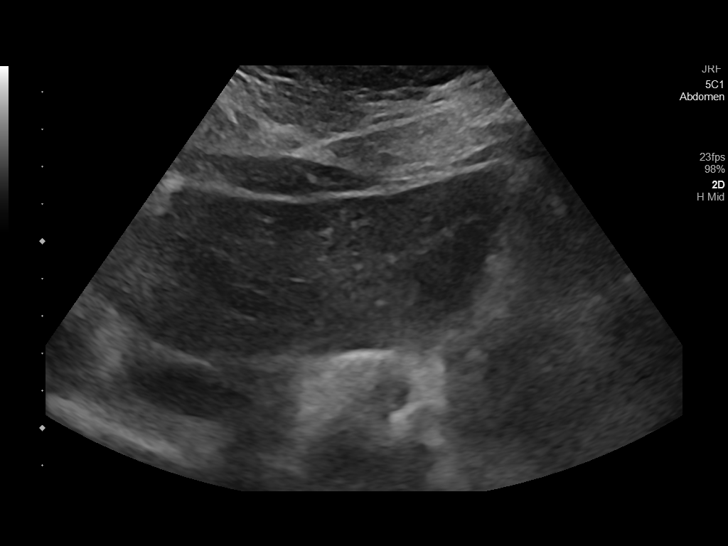
[im 27/50]
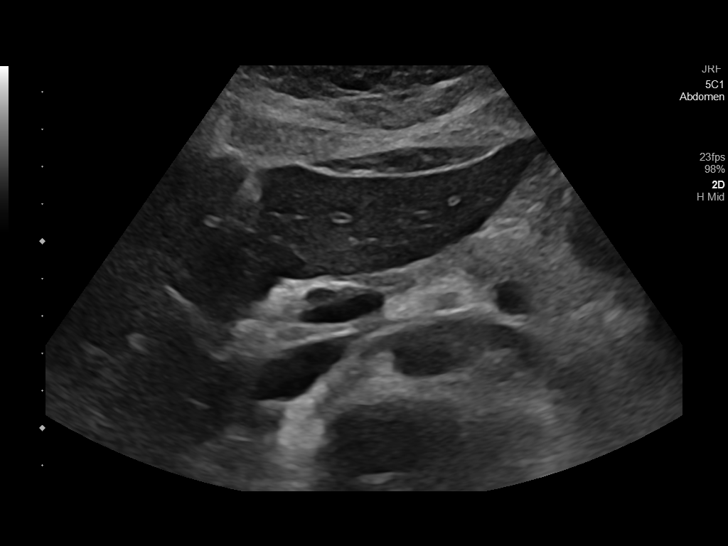
[im 31/50]
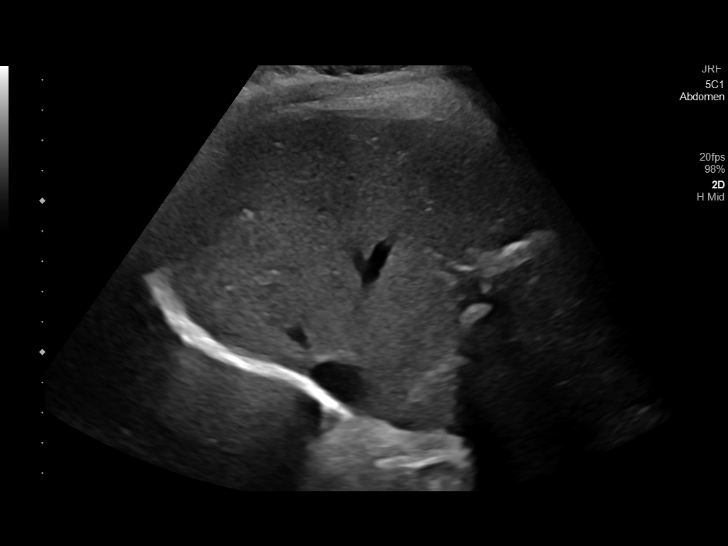
[im 33/50]
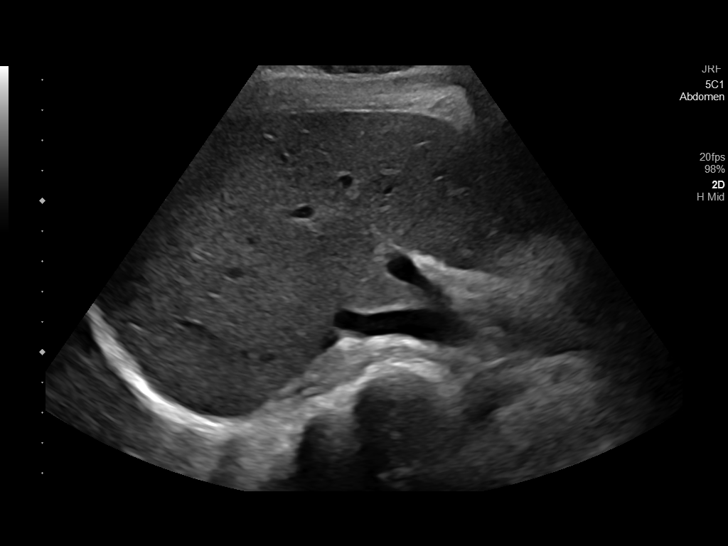
[im 37/50]
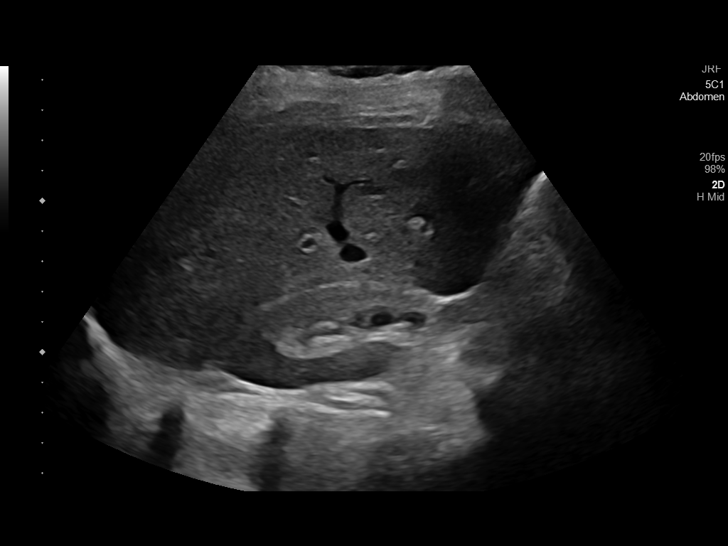
[im 41/50]
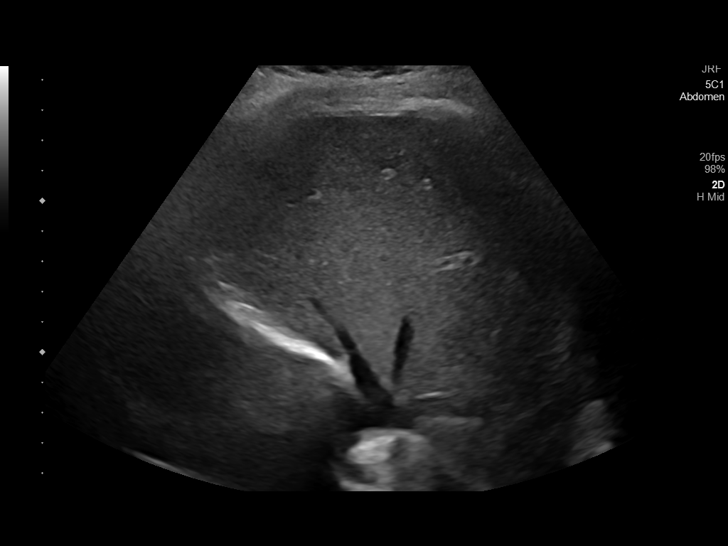
[im 45/50]
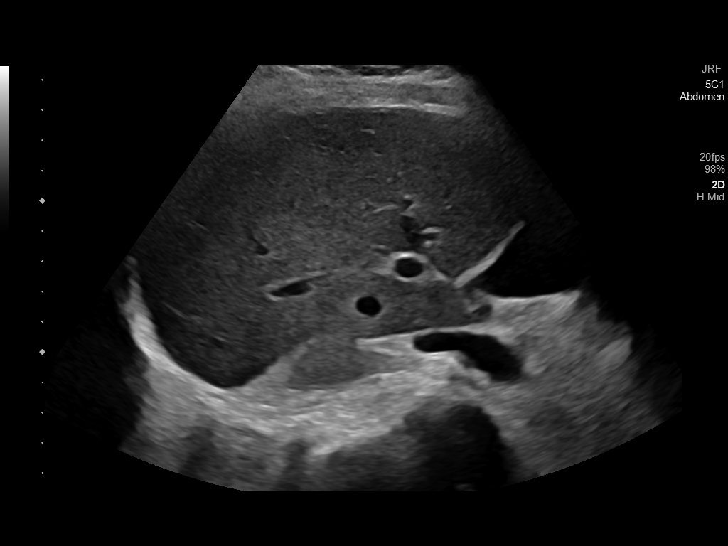
[im 50/50]
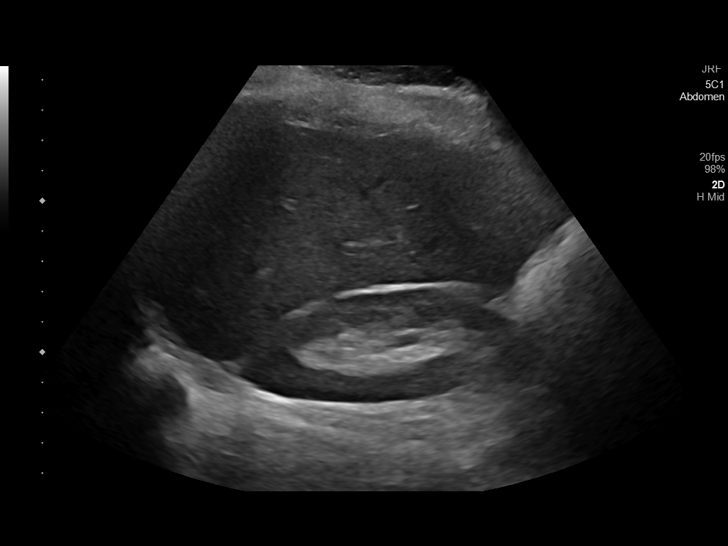

[14 of 25 positions shown; findings below may reference images not displayed]

FINDINGS: Gallbladder:

No gallstones or wall thickening visualized. No sonographic Murphy
sign noted by sonographer.

Common bile duct:

Diameter: 1 mm which is within normal limits.

Liver:

No focal lesion identified. Within normal limits in parenchymal
echogenicity. Portal vein is patent on color Doppler imaging with
normal direction of blood flow towards the liver.

Other: None.
IMPRESSION: No definite abnormality seen in the right upper quadrant of the
abdomen.

## 2023-07-01 ENCOUNTER — Emergency Department
Admission: EM | Admit: 2023-07-01 | Discharge: 2023-07-01 | Disposition: A | Discharge status: Home / Self Care | Payer: Self-pay | Attending: Student in an Organized Health Care Education/Training Program | Admitting: Student in an Organized Health Care Education/Training Program

## 2023-07-01 ENCOUNTER — Other Ambulatory Visit: Payer: Self-pay

## 2023-07-01 DIAGNOSIS — J101 Influenza due to other identified influenza virus with other respiratory manifestations: Secondary | ICD-10-CM | POA: Insufficient documentation

## 2023-07-01 DIAGNOSIS — U071 COVID-19: Secondary | ICD-10-CM | POA: Insufficient documentation

## 2023-07-01 LAB — RESP PANEL BY RT-PCR (RSV, FLU A&B, COVID)  RVPGX2
Influenza A by PCR: NEGATIVE
Influenza B by PCR: NEGATIVE
Resp Syncytial Virus by PCR: NEGATIVE
SARS Coronavirus 2 by RT PCR: POSITIVE — AB

## 2023-07-01 NOTE — ED Provider Notes (Signed)
[facility] Provider Note    Event Date/Time   First MD Initiated Contact with Patient 07/01/23 1153     (approximate)   History   Fever and Sore Throat   HPI  Erin Hartman is a 38 y.o. female with no significant past medical history presents emergency department with complaints of fever, headache, sore throat and runny nose.  Some body aches.  States been going on and off for a while.  He got better and started feeling bad about 3 days ago.  No vomiting or diarrhea      Physical Exam   Triage Vital Signs: ED Triage Vitals  Encounter Vitals Group     BP 07/01/23 1122 116/79     Systolic BP Percentile --      Diastolic BP Percentile --      Pulse Rate 07/01/23 1122 66     Resp 07/01/23 1122 19     Temp 07/01/23 1122 98.3 F (36.8 C)     Temp src --      SpO2 07/01/23 1122 99 %     Weight 07/01/23 1120 150 lb (68 kg)     Height 07/01/23 1120 5\' 4"  (1.626 m)     Head Circumference --      Peak Flow --      Pain Score 07/01/23 1120 6     Pain Loc --      Pain Education --      Exclude from Growth Chart --     Most recent vital signs: Vitals:   07/01/23 1122  BP: 116/79  Pulse: 66  Resp: 19  Temp: 98.3 F (36.8 C)  SpO2: 99%     General: Awake, no distress.   CV:  Good peripheral perfusion. regular rate and  rhythm Resp:  Normal effort. Lungs CTA Abd:  No distention.   Other:     ED Results / Procedures / Treatments   Labs (all labs ordered are listed, but only abnormal results are displayed) Labs Reviewed  RESP PANEL BY RT-PCR (RSV, FLU A&B, COVID)  RVPGX2 - Abnormal; Notable for the following components:      Result Value   SARS Coronavirus 2 by RT PCR POSITIVE (*)    All other components within normal limits     EKG     RADIOLOGY     PROCEDURES:   Procedures Chief Complaint  Patient presents with   Fever   Sore Throat      MEDICATIONS ORDERED IN ED: Medications - No data to  display   IMPRESSION / MDM / ASSESSMENT AND PLAN / ED COURSE  I reviewed the triage vital signs and the nursing notes.                              Differential diagnosis includes, but is not limited to, COVID, influenza, RSV, viral URI, CAP  Patient's presentation is most consistent with acute illness / injury with system symptoms.   Respiratory panel is positive for COVID.  Do feel this explains all the patient's symptoms therefore will not do further workup.  Patient is in agreement treatment plan.  Over-the-counter measures discussed.  Follow-up with your regular doctor if not improved in 3 days.  Return if worsening.  Work note provided.  Patient was discharged stable condition.      FINAL CLINICAL IMPRESSION(S) / ED DIAGNOSES   Final diagnoses:  COVID  Rx / DC Orders   ED Discharge Orders     None        Note:  This document was prepared using Dragon voice recognition software and may include unintentional dictation errors.    Faythe Ghee, PA-C 07/01/23 1749    Willy Eddy, MD 07/02/23 1153

## 2023-07-01 NOTE — ED Triage Notes (Signed)
Pt comes with c/o fever, headache, sore throat and runny nose. Pt states this has been going on for awhile on and off.

## 2023-10-24 ENCOUNTER — Emergency Department
Admission: EM | Admit: 2023-10-24 | Discharge: 2023-10-24 | Disposition: A | Discharge status: Home / Self Care | Payer: Self-pay | Attending: Emergency Medicine | Admitting: Emergency Medicine

## 2023-10-24 ENCOUNTER — Other Ambulatory Visit: Payer: Self-pay

## 2023-10-24 DIAGNOSIS — N764 Abscess of vulva: Secondary | ICD-10-CM | POA: Insufficient documentation

## 2023-10-24 DIAGNOSIS — L0291 Cutaneous abscess, unspecified: Secondary | ICD-10-CM

## 2023-10-24 MED ORDER — SULFAMETHOXAZOLE-TRIMETHOPRIM 800-160 MG PO TABS: 1.0000 | ORAL_TABLET | Freq: Two times a day (BID) | ORAL | 0 refills | Status: AC

## 2023-10-24 NOTE — ED Triage Notes (Signed)
 Pt arrives via POV with c/o a "pimple like mass" a little larger than a lemon seed in size between the opening of their vagina and their rectum. Pt first noticed it about a week ago  and this morning the spot started to bleed. The pain kept her up last night and she stated that there was maybe a teaspoon of bright red blood when they wiped this morning. Pt is A&Ox4 and ambulatory during triage.

## 2023-10-24 NOTE — Discharge Instructions (Signed)
 You have been seen in the Emergency Department (ED) today for an abscess.  Please take the antibiotic as prescribed and finish it out even if you are feeling better.  You can use Tylenol  and/or ibuprofen  as needed for pain.  Keep the wound clean and dry, though you may wash as you would normally.  You can use warm water soaks and warm compresses to help as well.  Call your doctor sooner or return to the ED if you develop worsening signs of infection such as: increased redness, increased pain, pus, or fever.   Abscess An abscess is an infected area that contains a collection of pus and debris. It can occur in almost any part of the body. An abscess is also known as a furuncle or boil. CAUSES  An abscess occurs when tissue gets infected. This can occur from blockage of oil or sweat glands, infection of hair follicles, or a minor injury to the skin. As the body tries to fight the infection, pus collects in the area and creates pressure under the skin. This pressure causes pain. People with weakened immune systems have difficulty fighting infections and get certain abscesses more often.  SYMPTOMS Usually an abscess develops on the skin and becomes a painful mass that is red, warm, and tender. If the abscess forms under the skin, you may feel a moveable soft area under the skin. Some abscesses break open (rupture) on their own, but most will continue to get worse without care. The infection can spread deeper into the body and eventually into the bloodstream, causing you to feel ill.  DIAGNOSIS  Your caregiver will take your medical history and perform a physical exam. A sample of fluid may also be taken from the abscess to determine what is causing your infection. TREATMENT  Your caregiver may prescribe antibiotic medicines to fight the infection. However, taking antibiotics alone usually does not cure an abscess. Your caregiver may need to make a small cut (incision) in the abscess to drain the pus. In  some cases, gauze is packed into the abscess to reduce pain and to continue draining the area. HOME CARE INSTRUCTIONS  Only take over-the-counter or prescription medicines for pain, discomfort, or fever as directed by your caregiver. If you were prescribed antibiotics, take them as directed. Finish them even if you start to feel better. If gauze is used, follow your caregiver's directions for changing the gauze. To avoid spreading the infection: Keep your draining abscess covered with a bandage. Wash your hands well. Do not share personal care items, towels, or whirlpools with others. Avoid skin contact with others. Keep your skin and clothes clean around the abscess. Keep all follow-up appointments as directed by your caregiver. SEEK MEDICAL CARE IF:  You have increased pain, swelling, redness, fluid drainage, or bleeding. You have muscle aches, chills, or a general ill feeling. You have a fever. MAKE SURE YOU:  Understand these instructions. Will watch your condition. Will get help right away if you are not doing well or get worse. Document Released: 02/12/2005 Document Revised: 11/04/2011 Document Reviewed: 07/18/2011 [facility] Patient Information 2015 [place], [place]. This information is not intended to replace advice given to you by your health care provider. Make sure you discuss any questions you have with your health care provider.  Abscess Care After An abscess (also called a boil or furuncle) is an infected area that contains a collection of pus. Signs and symptoms of an abscess include pain, tenderness, redness, or hardness, or you may  feel a moveable soft area under your skin. An abscess can occur anywhere in the body. The infection may spread to surrounding tissues causing cellulitis. A cut (incision) by the surgeon was made over your abscess and the pus was drained out. Gauze may have been packed into the space to provide a drain that will allow the cavity to heal from the  inside outwards. The boil may be painful for 5 to 7 days. Most people with a boil do not have high fevers. Your abscess, if seen early, may not have localized, and may not have been lanced. If not, another appointment may be required for this if it does not get better on its own or with medications. HOME CARE INSTRUCTIONS  Only take over-the-counter or prescription medicines for pain, discomfort, or fever as directed by your caregiver. When you bathe, soak and then remove gauze or iodoform packs at least daily or as directed by your caregiver. You may then wash the wound gently with mild soapy water. Repack with gauze or do as your caregiver directs. SEEK IMMEDIATE MEDICAL CARE IF:  You develop increased pain, swelling, redness, drainage, or bleeding in the wound site. You develop signs of generalized infection including muscle aches, chills, fever, or a general ill feeling. An oral temperature above 102 F (38.9 C) develops, not controlled by medication. See your caregiver for a recheck if you develop any of the symptoms described above. If medications (antibiotics) were prescribed, take them as directed. Document Released: 11/21/2004 Document Revised: 07/28/2011 Document Reviewed: 07/19/2007 [facility] Patient Information 2015 [place], [place]. This information is not intended to replace advice given to you by your health care provider. Make sure you discuss any questions you have with your health care provider.  Cellulitis Cellulitis is an infection of the skin and the tissue beneath it. The infected area is usually red and tender. Cellulitis occurs most often in the arms and lower legs.  CAUSES  Cellulitis is caused by bacteria that enter the skin through cracks or cuts in the skin. The most common types of bacteria that cause cellulitis are staphylococci and streptococci. SIGNS AND SYMPTOMS  Redness and warmth. Swelling. Tenderness or pain. Fever. DIAGNOSIS  Your health care provider can  usually determine what is wrong based on a physical exam. Blood tests may also be done. TREATMENT  Treatment usually involves taking an antibiotic medicine. HOME CARE INSTRUCTIONS  Take your antibiotic medicine as directed by your health care provider. Finish the antibiotic even if you start to feel better. Keep the infected arm or leg elevated to reduce swelling. Apply a warm cloth to the affected area up to 4 times per day to relieve pain. Take medicines only as directed by your health care provider. Keep all follow-up visits as directed by your health care provider. SEEK MEDICAL CARE IF:  You notice red streaks coming from the infected area. Your red area gets larger or turns dark in color. Your bone or joint underneath the infected area becomes painful after the skin has healed. Your infection returns in the same area or another area. You notice a swollen bump in the infected area. You develop new symptoms. You have a fever. SEEK IMMEDIATE MEDICAL CARE IF:  You feel very sleepy. You develop vomiting or diarrhea. You have a general ill feeling (malaise) with muscle aches and pains. MAKE SURE YOU:  Understand these instructions. Will watch your condition. Will get help right away if you are not doing well or get worse. Document Released:  02/12/2005 Document Revised: 09/19/2013 Document Reviewed: 07/21/2011 [employer] Patient Information 2015 [place], [place]. This information is not intended to replace advice given to you by your health care provider. Make sure you discuss any questions you have with your health care provider.

## 2023-10-24 NOTE — ED Provider Notes (Signed)
 [facility] Provider Note    Event Date/Time   First MD Initiated Contact with Patient 10/24/23 1116     (approximate)   History   Abscess   HPI  Erin Hartman is a 38 y.o. female presenting to the emergency department with "pimple-like mass" between her vaginal opening and her rectum.  She states this has been present for about 1 week.  She states she wiped this morning and noticed a few droplets of blood in the toilet and that the spot started to bleed.  She states the area is itchy.  Denies fever, dysuria, vaginal discharge,  difficulty pooping, pus-like discharge, shortness of breath, chest pain, nausea, vomiting, diarrhea.  Last menstrual period was May 5, she thinks her next period has started today.  Last bowel movement was yesterday. No pertinent past medical history. No allergies.   Physical Exam   Triage Vital Signs: ED Triage Vitals  Encounter Vitals Group     BP 10/24/23 1056 (!) 123/109     Systolic BP Percentile --      Diastolic BP Percentile --      Pulse Rate 10/24/23 1056 71     Resp 10/24/23 1056 16     Temp 10/24/23 1056 98.2 F (36.8 C)     Temp Source 10/24/23 1056 Oral     SpO2 10/24/23 1056 100 %     Weight 10/24/23 1058 149 lb (67.6 kg)     Height 10/24/23 1058 5\' 3"  (1.6 m)     Head Circumference --      Peak Flow --      Pain Score 10/24/23 1056 7     Pain Loc --      Pain Education --      Exclude from Growth Chart --     Most recent vital signs: Vitals:   10/24/23 1056  BP: (!) 123/109  Pulse: 71  Resp: 16  Temp: 98.2 F (36.8 C)  SpO2: 100%    General: Awake, no distress.  CV:  Good peripheral perfusion.  Resp:  Normal effort.  Abd:  No distention.  Other:  Minor irritation and bleeding noted to the vaginal opening. No obvious discharge. Small roughly 1 x 1 cm fluctuance noted at the 6 o'clock position on the labia majora. No puslike drainage or bleeding from the area.  No surrounding erythema  or warmth.   ED Results / Procedures / Treatments   Labs (all labs ordered are listed, but only abnormal results are displayed) Labs Reviewed - No data to display   EKG     RADIOLOGY   PROCEDURES:  Critical Care performed: No  Procedures   MEDICATIONS ORDERED IN ED: Medications - No data to display   IMPRESSION / MDM / ASSESSMENT AND PLAN / ED COURSE  I reviewed the triage vital signs and the nursing notes.                              Differential diagnosis includes, but is not limited to, abscess, cellulitis, fissure, STI, UTI  Patient's presentation is most consistent with acute, uncomplicated illness.  Patient's presentation is most consistent with an abscess.  Physical exam shows small fluctuant area noted in the labia majora region on the right side in the 6 o'clock position.  Discussed warm compresses and having warm baths to help with pain.  Also discussed use of Tylenol  and/or ibuprofen  as needed for  pain.  I am prescribing her antibiotics (Bactrim), sent to her pharmacy of choice.  Emergency department return precautions were discussed with the patient.  Patient is in agreement to the treatment plan.  Patient is stable for discharge.    FINAL CLINICAL IMPRESSION(S) / ED DIAGNOSES   Final diagnoses:  Abscess     Rx / DC Orders   ED Discharge Orders          Ordered    sulfamethoxazole-trimethoprim (BACTRIM DS) 800-160 MG tablet  2 times daily        10/24/23 1202             Note:  This document was prepared using Dragon voice recognition software and may include unintentional dictation errors.    Thomasenia Flesher, PA-C 10/24/23 1226    Lubertha Rush, MD 10/25/23 1536
